# Patient Record
Sex: Female | Born: 1995 | Race: White | Hispanic: No | Marital: Single | Smoking: Never smoker
Health system: Southern US, Community
[De-identification: ages and names within clinical notes are randomized; demographics above are authoritative.]

## PROBLEM LIST (undated history)

## (undated) ENCOUNTER — Inpatient Hospital Stay (HOSPITAL_COMMUNITY): Payer: Self-pay

## (undated) DIAGNOSIS — K5190 Ulcerative colitis, unspecified, without complications: Secondary | ICD-10-CM

## (undated) DIAGNOSIS — Z8489 Family history of other specified conditions: Secondary | ICD-10-CM

## (undated) DIAGNOSIS — T8859XA Other complications of anesthesia, initial encounter: Secondary | ICD-10-CM

## (undated) DIAGNOSIS — J45909 Unspecified asthma, uncomplicated: Secondary | ICD-10-CM

## (undated) DIAGNOSIS — F419 Anxiety disorder, unspecified: Secondary | ICD-10-CM

## (undated) DIAGNOSIS — T4145XA Adverse effect of unspecified anesthetic, initial encounter: Secondary | ICD-10-CM

## (undated) DIAGNOSIS — K219 Gastro-esophageal reflux disease without esophagitis: Secondary | ICD-10-CM

## (undated) DIAGNOSIS — K51211 Ulcerative (chronic) proctitis with rectal bleeding: Secondary | ICD-10-CM

## (undated) DIAGNOSIS — K529 Noninfective gastroenteritis and colitis, unspecified: Secondary | ICD-10-CM

## (undated) DIAGNOSIS — D649 Anemia, unspecified: Secondary | ICD-10-CM

## (undated) HISTORY — DX: Ulcerative (chronic) proctitis with rectal bleeding: K51.211

---
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
## 2015-03-17 DIAGNOSIS — K920 Hematemesis: Secondary | ICD-10-CM | POA: Insufficient documentation

## 2015-03-17 DIAGNOSIS — K219 Gastro-esophageal reflux disease without esophagitis: Secondary | ICD-10-CM | POA: Insufficient documentation

## 2015-03-17 DIAGNOSIS — K5190 Ulcerative colitis, unspecified, without complications: Secondary | ICD-10-CM | POA: Insufficient documentation

## 2015-03-17 DIAGNOSIS — R1030 Lower abdominal pain, unspecified: Secondary | ICD-10-CM | POA: Insufficient documentation

## 2015-04-28 ENCOUNTER — Other Ambulatory Visit: Payer: Self-pay | Admitting: Physician Assistant

## 2015-04-28 DIAGNOSIS — M542 Cervicalgia: Secondary | ICD-10-CM

## 2015-04-28 DIAGNOSIS — M545 Low back pain: Secondary | ICD-10-CM

## 2015-04-28 DIAGNOSIS — M546 Pain in thoracic spine: Secondary | ICD-10-CM

## 2015-05-11 ENCOUNTER — Ambulatory Visit: Payer: Medicaid Other | Admitting: Neurology

## 2015-05-16 ENCOUNTER — Ambulatory Visit
Admission: RE | Admit: 2015-05-16 | Discharge: 2015-05-16 | Disposition: A | Discharge status: Home / Self Care | Payer: Medicaid Other | Source: Ambulatory Visit | Attending: Physician Assistant | Admitting: Physician Assistant

## 2015-05-16 ENCOUNTER — Encounter: Payer: Self-pay | Admitting: Neurology

## 2015-05-16 DIAGNOSIS — M545 Low back pain: Secondary | ICD-10-CM

## 2015-05-16 DIAGNOSIS — M542 Cervicalgia: Secondary | ICD-10-CM

## 2015-05-16 DIAGNOSIS — M546 Pain in thoracic spine: Secondary | ICD-10-CM

## 2015-05-18 ENCOUNTER — Ambulatory Visit (INDEPENDENT_AMBULATORY_CARE_PROVIDER_SITE_OTHER): Payer: Medicaid Other | Admitting: Neurology

## 2015-05-18 ENCOUNTER — Encounter: Payer: Self-pay | Admitting: Neurology

## 2015-05-18 VITALS — BP 104/76 | HR 64 | Resp 16 | Ht 63.5 in | Wt 152.0 lb

## 2015-05-18 DIAGNOSIS — F329 Major depressive disorder, single episode, unspecified: Secondary | ICD-10-CM | POA: Diagnosis not present

## 2015-05-18 DIAGNOSIS — G8929 Other chronic pain: Secondary | ICD-10-CM | POA: Diagnosis not present

## 2015-05-18 DIAGNOSIS — F411 Generalized anxiety disorder: Secondary | ICD-10-CM

## 2015-05-18 DIAGNOSIS — M549 Dorsalgia, unspecified: Secondary | ICD-10-CM

## 2015-05-18 DIAGNOSIS — R404 Transient alteration of awareness: Secondary | ICD-10-CM | POA: Diagnosis not present

## 2015-05-18 DIAGNOSIS — F4310 Post-traumatic stress disorder, unspecified: Secondary | ICD-10-CM | POA: Insufficient documentation

## 2015-05-18 DIAGNOSIS — F32A Depression, unspecified: Secondary | ICD-10-CM | POA: Insufficient documentation

## 2015-05-18 MED ORDER — BUSPIRONE HCL 15 MG PO TABS: 15.0000 mg | ORAL_TABLET | Freq: Three times a day (TID) | ORAL | Status: DC
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
## 2015-05-30 ENCOUNTER — Ambulatory Visit
Admission: RE | Admit: 2015-05-30 | Discharge: 2015-05-30 | Disposition: A | Discharge status: Home / Self Care | Payer: Medicaid Other | Source: Ambulatory Visit | Attending: Neurology | Admitting: Neurology

## 2015-05-30 DIAGNOSIS — R404 Transient alteration of awareness: Secondary | ICD-10-CM | POA: Diagnosis not present

## 2015-06-07 ENCOUNTER — Ambulatory Visit (INDEPENDENT_AMBULATORY_CARE_PROVIDER_SITE_OTHER): Payer: Medicaid Other | Admitting: Neurology

## 2015-06-07 ENCOUNTER — Telehealth: Payer: Self-pay | Admitting: Neurology

## 2015-06-07 DIAGNOSIS — R4182 Altered mental status, unspecified: Secondary | ICD-10-CM | POA: Diagnosis not present

## 2015-06-07 DIAGNOSIS — R404 Transient alteration of awareness: Secondary | ICD-10-CM

## 2015-06-07 NOTE — Telephone Encounter (Signed)
Patient called 9:48am to advise she is running a little behind, will be here in 25 minutes for 10:30am EEG appointment.

## 2015-06-07 NOTE — Progress Notes (Signed)
   STUDY DATE: 06/07/2015  PATIENT NAME: [name] [name] [id number] DOB: 1995-05-20 MRN: [id number]   TECHNIQUE: Electroencephalogram was recorded utilizing standard 10-20 system of lead placement and reformatted into average and bipolar montages.  RECORDING TIME: 25 minutes ACTIVATION: Photic stimulation and hyperventilation  CLINICAL INFORMATION: 20 year old woman with episodes of transient alteration of awareness. There has not been generalized tonic-clonic activity.  FINDINGS: There was an occipital background rhythm of 12 hertz that reacted to eye opening and closing with intermixed activity anteriorly. There were no focal, lateralizing, epileptiform activity or seizures seen. Patient was recorded in the awake, drowsy and sleep state. There was a normal response to photic simulation. Hyperventilation did not change the underlying rhythms. EKG channel shows no arrhythmias.  IMPRESSION: This is a normal EKG was the patient was awake and asleep.   Babe Anthis A. Felecia Shelling, MD, PhD Certified in Neurology, [place] Neurophysiology, Sleep Medicine, Pain Medicine and Neuroimaging  [facility] [address], [place], [place] [address] [phone]

## 2015-06-15 ENCOUNTER — Ambulatory Visit (INDEPENDENT_AMBULATORY_CARE_PROVIDER_SITE_OTHER): Payer: Medicaid Other | Admitting: Neurology

## 2015-06-15 ENCOUNTER — Encounter: Payer: Self-pay | Admitting: Neurology

## 2015-06-15 VITALS — BP 94/64 | HR 70 | Resp 16 | Ht 63.5 in | Wt 147.0 lb

## 2015-06-15 DIAGNOSIS — R404 Transient alteration of awareness: Secondary | ICD-10-CM | POA: Diagnosis not present

## 2015-06-15 DIAGNOSIS — F32A Depression, unspecified: Secondary | ICD-10-CM

## 2015-06-15 DIAGNOSIS — F4310 Post-traumatic stress disorder, unspecified: Secondary | ICD-10-CM | POA: Diagnosis not present

## 2015-06-15 DIAGNOSIS — G8929 Other chronic pain: Secondary | ICD-10-CM

## 2015-06-15 DIAGNOSIS — F329 Major depressive disorder, single episode, unspecified: Secondary | ICD-10-CM

## 2015-06-15 DIAGNOSIS — F411 Generalized anxiety disorder: Secondary | ICD-10-CM

## 2015-06-15 DIAGNOSIS — M549 Dorsalgia, unspecified: Secondary | ICD-10-CM

## 2015-06-15 MED ORDER — BUSPIRONE HCL 15 MG PO TABS: 15.0000 mg | ORAL_TABLET | Freq: Two times a day (BID) | ORAL | Status: DC
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
## 2015-06-22 ENCOUNTER — Telehealth: Payer: Self-pay | Admitting: Neurology

## 2015-06-22 DIAGNOSIS — F411 Generalized anxiety disorder: Secondary | ICD-10-CM

## 2015-06-22 NOTE — Telephone Encounter (Signed)
Patient came in and said the Buspar 15 MG is making her feel very dizzy. She said she only taking half the 15 MG and once she starts to take it shortly after is when she is starting to feel dizzy spells and when she is at work it happens and she has to sit down for a little bit. She said she is also on birth control. The best number to contact the patient is [phone]

## 2015-06-22 NOTE — Telephone Encounter (Signed)
Let's refer to Behavioral health for anxiety

## 2015-06-23 ENCOUNTER — Telehealth: Payer: Self-pay | Admitting: *Deleted

## 2015-06-23 DIAGNOSIS — Z0289 Encounter for other administrative examinations: Secondary | ICD-10-CM

## 2015-06-23 NOTE — Telephone Encounter (Signed)
LMTC./fim 

## 2015-06-23 NOTE — Telephone Encounter (Signed)
Pt DMV form on [employer].

## 2015-06-30 NOTE — Telephone Encounter (Signed)
DMV paperwork completed.  I was going to fax this back to the [facility] DMV for pt., but she has not completed her portion and signed, on p. 1.  I lmom for pt. that I will mail the originals to her home address.  She can send them back to the [facility] after competing her portion.  Copy sent to be scanned into EPIC/fim

## 2015-06-30 NOTE — Telephone Encounter (Signed)
I have spoken with [name] [name] this morning and advised that 1--DMV paperwork will be completed today, and per her request, will fax to [facility] DMV and mail her a copy, and 2--[facility] would like to refer her to behavioral health for treatment of anxiety.  [name] [name] is agreeable.  Referral ordered/fim

## 2015-07-14 ENCOUNTER — Telehealth: Payer: Self-pay | Admitting: *Deleted

## 2015-07-14 NOTE — Telephone Encounter (Signed)
Message For: OFFICE               Taken  1-JUN-17 at  2:12PM by [name] ------------------------------------------------------------ [name] [name]         CID [id number]  Patient SAME                 Pt's Dr Felecia Shelling        Area Code 336 Phone# 59 [place] [phone]     RE MISSED CALL FROM THE NURSE, PLS C/B                                                                    Disp:Y/N N If Y = C/B If No Response In 89minutes ============================================================

## 2015-07-14 NOTE — Telephone Encounter (Signed)
I have spoken with [facility] this afternoon.  DMV paperwork has been faxed in/fim

## 2015-09-01 ENCOUNTER — Other Ambulatory Visit (HOSPITAL_COMMUNITY): Payer: Self-pay | Admitting: Obstetrics and Gynecology

## 2015-09-02 NOTE — Patient Instructions (Signed)
Your procedure is scheduled on:  Monday, September 12, 2015  Enter through the Main Entrance of [facility] at:  11:30 AM  Pick up the phone at the desk and dial [phone].  Call this number if you have problems the morning of surgery: [phone].  Remember: Do NOT eat food:  After Midnight Sunday, September 11, 2015  Do NOT drink clear liquids after:  9:00 AM day of surgery  Take these medicines the morning of surgery with a SIP OF WATER:  Apriso  Do NOT wear jewelry (body piercing), metal hair clips/bobby pins, make-up, or nail polish. Do NOT wear lotions, powders, or perfumes.  You may wear deodorant. Do NOT shave for 48 hours prior to surgery. Do NOT bring valuables to the hospital. Contacts, dentures, or bridgework may not be worn into surgery.  Have a responsible adult drive you home and stay with you for 24 hours after your procedure

## 2015-09-05 ENCOUNTER — Encounter (HOSPITAL_COMMUNITY)
Admission: RE | Admit: 2015-09-05 | Discharge: 2015-09-05 | Disposition: A | Discharge status: Home / Self Care | Payer: Medicaid Other | Source: Ambulatory Visit | Attending: Obstetrics and Gynecology | Admitting: Obstetrics and Gynecology

## 2015-09-05 ENCOUNTER — Encounter (HOSPITAL_COMMUNITY): Payer: Self-pay

## 2015-09-05 DIAGNOSIS — Z01818 Encounter for other preprocedural examination: Secondary | ICD-10-CM | POA: Insufficient documentation

## 2015-09-05 HISTORY — DX: Adverse effect of unspecified anesthetic, initial encounter: T41.45XA

## 2015-09-05 HISTORY — DX: Other complications of anesthesia, initial encounter: T88.59XA

## 2015-09-05 HISTORY — DX: Anemia, unspecified: D64.9

## 2015-09-05 LAB — CBC
HCT: 38.9 % (ref 36.0–46.0)
Hemoglobin: 12.7 g/dL (ref 12.0–15.0)
MCH: 28.3 pg (ref 26.0–34.0)
MCHC: 32.6 g/dL (ref 30.0–36.0)
MCV: 86.8 fL (ref 78.0–100.0)
Platelets: 299 10*3/uL (ref 150–400)
RBC: 4.48 MIL/uL (ref 3.87–5.11)
RDW: 14 % (ref 11.5–15.5)
WBC: 8.5 10*3/uL (ref 4.0–10.5)

## 2015-09-12 ENCOUNTER — Ambulatory Visit (HOSPITAL_COMMUNITY)
Admission: RE | Admit: 2015-09-12 | Discharge: 2015-09-12 | Disposition: A | Discharge status: Home / Self Care | Payer: Medicaid Other | Source: Ambulatory Visit | Attending: Obstetrics and Gynecology | Admitting: Obstetrics and Gynecology

## 2015-09-12 ENCOUNTER — Ambulatory Visit (HOSPITAL_COMMUNITY): Payer: Medicaid Other | Admitting: Anesthesiology

## 2015-09-12 ENCOUNTER — Encounter (HOSPITAL_COMMUNITY)
Admission: RE | Disposition: A | Discharge status: Home / Self Care | Payer: Self-pay | Source: Ambulatory Visit | Attending: Obstetrics and Gynecology

## 2015-09-12 ENCOUNTER — Encounter (HOSPITAL_COMMUNITY): Payer: Self-pay | Admitting: Emergency Medicine

## 2015-09-12 DIAGNOSIS — Z113 Encounter for screening for infections with a predominantly sexual mode of transmission: Secondary | ICD-10-CM | POA: Diagnosis not present

## 2015-09-12 DIAGNOSIS — D270 Benign neoplasm of right ovary: Secondary | ICD-10-CM | POA: Insufficient documentation

## 2015-09-12 DIAGNOSIS — R102 Pelvic and perineal pain: Secondary | ICD-10-CM | POA: Diagnosis not present

## 2015-09-12 DIAGNOSIS — Z823 Family history of stroke: Secondary | ICD-10-CM | POA: Diagnosis not present

## 2015-09-12 DIAGNOSIS — N83201 Unspecified ovarian cyst, right side: Secondary | ICD-10-CM | POA: Diagnosis present

## 2015-09-12 HISTORY — PX: LAPAROSCOPY: SHX197

## 2015-09-12 HISTORY — DX: Family history of other specified conditions: Z84.89

## 2015-09-12 HISTORY — PX: LAPAROSCOPIC OVARIAN CYSTECTOMY: SHX6248

## 2015-09-12 LAB — PREGNANCY, URINE: PREG TEST UR: NEGATIVE

## 2015-09-12 SURGERY — LAPAROSCOPY, DIAGNOSTIC
Anesthesia: General | Site: Abdomen | Laterality: Right

## 2015-09-12 MED ORDER — NEOSTIGMINE METHYLSULFATE 10 MG/10ML IV SOLN
INTRAVENOUS | Status: AC
  Filled 2015-09-12: qty 1

## 2015-09-12 MED ORDER — HYDROMORPHONE HCL 2 MG PO TABS
2.0000 mg | ORAL_TABLET | Freq: Once | ORAL | Status: AC
  Administered 2015-09-12: 2 mg via ORAL

## 2015-09-12 MED ORDER — NEOSTIGMINE METHYLSULFATE 10 MG/10ML IV SOLN
INTRAVENOUS | Status: DC | PRN
  Administered 2015-09-12: 3 mg via INTRAVENOUS

## 2015-09-12 MED ORDER — DEXAMETHASONE SODIUM PHOSPHATE 4 MG/ML IJ SOLN
INTRAMUSCULAR | Status: DC | PRN
  Administered 2015-09-12: 4 mg via INTRAVENOUS

## 2015-09-12 MED ORDER — ONDANSETRON HCL 4 MG/2ML IJ SOLN
INTRAMUSCULAR | Status: DC | PRN
  Administered 2015-09-12 (×2): 2 mg via INTRAVENOUS

## 2015-09-12 MED ORDER — HYDROMORPHONE HCL 2 MG PO TABS
ORAL_TABLET | ORAL | Status: AC
  Filled 2015-09-12: qty 1

## 2015-09-12 MED ORDER — PROMETHAZINE HCL 25 MG/ML IJ SOLN: 6.2500 mg | INTRAMUSCULAR | Status: DC | PRN

## 2015-09-12 MED ORDER — LIDOCAINE HCL (CARDIAC) 20 MG/ML IV SOLN
INTRAVENOUS | Status: AC
  Filled 2015-09-12: qty 5

## 2015-09-12 MED ORDER — BUPIVACAINE HCL (PF) 0.25 % IJ SOLN
INTRAMUSCULAR | Status: DC | PRN
  Administered 2015-09-12: 60 mL

## 2015-09-12 MED ORDER — HEPARIN SODIUM (PORCINE) 5000 UNIT/ML IJ SOLN
INTRAMUSCULAR | Status: AC
  Filled 2015-09-12: qty 1

## 2015-09-12 MED ORDER — LACTATED RINGERS IV SOLN
INTRAVENOUS | Status: DC | PRN
Start: 2015-09-12 — End: 2015-09-12
  Administered 2015-09-12 (×2): via INTRAVENOUS

## 2015-09-12 MED ORDER — BUPIVACAINE HCL (PF) 0.25 % IJ SOLN
INTRAMUSCULAR | Status: AC
  Filled 2015-09-12: qty 60

## 2015-09-12 MED ORDER — GLYCOPYRROLATE 0.2 MG/ML IJ SOLN
INTRAMUSCULAR | Status: DC | PRN
  Administered 2015-09-12: 0.4 mg via INTRAVENOUS
  Administered 2015-09-12 (×2): 0.1 mg via INTRAVENOUS

## 2015-09-12 MED ORDER — DEXAMETHASONE SODIUM PHOSPHATE 10 MG/ML IJ SOLN
INTRAMUSCULAR | Status: AC
  Filled 2015-09-12: qty 1

## 2015-09-12 MED ORDER — MIDAZOLAM HCL 2 MG/2ML IJ SOLN
INTRAMUSCULAR | Status: AC
  Filled 2015-09-12: qty 2

## 2015-09-12 MED ORDER — ACETAMINOPHEN 500 MG PO TABS: ORAL_TABLET | ORAL | 0 refills | Status: DC

## 2015-09-12 MED ORDER — SCOPOLAMINE 1 MG/3DAYS TD PT72
1.0000 | MEDICATED_PATCH | Freq: Once | TRANSDERMAL | Status: DC
  Administered 2015-09-12: 1.5 mg via TRANSDERMAL

## 2015-09-12 MED ORDER — PROPOFOL 10 MG/ML IV BOLUS
INTRAVENOUS | Status: AC
  Filled 2015-09-12: qty 20

## 2015-09-12 MED ORDER — FENTANYL CITRATE (PF) 100 MCG/2ML IJ SOLN
25.0000 ug | INTRAMUSCULAR | Status: DC | PRN
  Administered 2015-09-12: 50 ug via INTRAVENOUS

## 2015-09-12 MED ORDER — GLYCOPYRROLATE 0.2 MG/ML IJ SOLN
INTRAMUSCULAR | Status: AC
  Filled 2015-09-12: qty 3

## 2015-09-12 MED ORDER — FENTANYL CITRATE (PF) 100 MCG/2ML IJ SOLN
INTRAMUSCULAR | Status: AC
  Filled 2015-09-12: qty 2

## 2015-09-12 MED ORDER — SCOPOLAMINE 1 MG/3DAYS TD PT72
MEDICATED_PATCH | TRANSDERMAL | Status: AC
  Administered 2015-09-12: 1.5 mg via TRANSDERMAL
  Filled 2015-09-12: qty 1

## 2015-09-12 MED ORDER — LACTATED RINGERS IR SOLN
Status: DC | PRN
  Administered 2015-09-12: 3000 mL

## 2015-09-12 MED ORDER — GLYCOPYRROLATE 0.2 MG/ML IJ SOLN
INTRAMUSCULAR | Status: AC
  Filled 2015-09-12: qty 4

## 2015-09-12 MED ORDER — ROCURONIUM BROMIDE 100 MG/10ML IV SOLN
INTRAVENOUS | Status: AC
  Filled 2015-09-12: qty 1

## 2015-09-12 MED ORDER — LIDOCAINE HCL (CARDIAC) 20 MG/ML IV SOLN
INTRAVENOUS | Status: DC | PRN
  Administered 2015-09-12: 70 mg via INTRAVENOUS

## 2015-09-12 MED ORDER — CEFAZOLIN SODIUM-DEXTROSE 2-3 GM-% IV SOLR
INTRAVENOUS | Status: DC | PRN
  Administered 2015-09-12: 2 g via INTRAVENOUS

## 2015-09-12 MED ORDER — LACTATED RINGERS IV SOLN
INTRAVENOUS | Status: DC
  Administered 2015-09-12 (×2): via INTRAVENOUS

## 2015-09-12 MED ORDER — MIDAZOLAM HCL 2 MG/2ML IJ SOLN
INTRAMUSCULAR | Status: DC | PRN
  Administered 2015-09-12: 1 mg via INTRAVENOUS

## 2015-09-12 MED ORDER — ROCURONIUM BROMIDE 100 MG/10ML IV SOLN
INTRAVENOUS | Status: DC | PRN
  Administered 2015-09-12: 30 mg via INTRAVENOUS
  Administered 2015-09-12 (×2): 10 mg via INTRAVENOUS

## 2015-09-12 MED ORDER — FENTANYL CITRATE (PF) 250 MCG/5ML IJ SOLN
INTRAMUSCULAR | Status: AC
  Filled 2015-09-12: qty 5

## 2015-09-12 MED ORDER — FENTANYL CITRATE (PF) 100 MCG/2ML IJ SOLN
INTRAMUSCULAR | Status: DC | PRN
  Administered 2015-09-12 (×4): 50 ug via INTRAVENOUS

## 2015-09-12 MED ORDER — KETOROLAC TROMETHAMINE 30 MG/ML IJ SOLN
INTRAMUSCULAR | Status: AC
  Filled 2015-09-12: qty 1

## 2015-09-12 MED ORDER — HYDROMORPHONE HCL 2 MG PO TABS: 2.0000 mg | ORAL_TABLET | ORAL | 0 refills | Status: DC | PRN

## 2015-09-12 MED ORDER — CEFAZOLIN SODIUM-DEXTROSE 2-4 GM/100ML-% IV SOLN: 2.0000 g | INTRAVENOUS | Status: DC

## 2015-09-12 MED ORDER — ONDANSETRON HCL 4 MG/2ML IJ SOLN
INTRAMUSCULAR | Status: AC
  Filled 2015-09-12: qty 2

## 2015-09-12 MED ORDER — PROPOFOL 10 MG/ML IV BOLUS
INTRAVENOUS | Status: DC | PRN
  Administered 2015-09-12: 150 mg via INTRAVENOUS

## 2015-09-12 SURGICAL SUPPLY — 32 items
CABLE HIGH FREQUENCY MONO STRZ (ELECTRODE) IMPLANT
CATH ROBINSON RED A/P 16FR (CATHETERS) IMPLANT
CLOTH BEACON ORANGE TIMEOUT ST (SAFETY) ×4 IMPLANT
DRSG COVADERM PLUS 2X2 (GAUZE/BANDAGES/DRESSINGS) ×8 IMPLANT
DRSG OPSITE POSTOP 3X4 (GAUZE/BANDAGES/DRESSINGS) IMPLANT
GLOVE BIOGEL M 6.5 STRL (GLOVE) ×8 IMPLANT
GLOVE BIOGEL PI IND STRL 6.5 (GLOVE) ×2 IMPLANT
GLOVE BIOGEL PI IND STRL 7.0 (GLOVE) ×2 IMPLANT
GLOVE BIOGEL PI INDICATOR 6.5 (GLOVE) ×2
GLOVE BIOGEL PI INDICATOR 7.0 (GLOVE) ×2
GOWN STRL REUS W/TWL LRG LVL3 (GOWN DISPOSABLE) ×8 IMPLANT
NS IRRIG 1000ML POUR BTL (IV SOLUTION) ×4 IMPLANT
PACK LAPAROSCOPY BASIN (CUSTOM PROCEDURE TRAY) ×4 IMPLANT
PAD TRENDELENBURG POSITION (MISCELLANEOUS) ×4 IMPLANT
POUCH SPECIMEN RETRIEVAL 10MM (ENDOMECHANICALS) IMPLANT
SEALER TISSUE G2 CVD JAW 35 (ENDOMECHANICALS) IMPLANT
SEALER TISSUE G2 CVD JAW 45CM (ENDOMECHANICALS) IMPLANT
SET IRRIG TUBING LAPAROSCOPIC (IRRIGATION / IRRIGATOR) IMPLANT
SHEARS HARMONIC ACE PLUS 36CM (ENDOMECHANICALS) IMPLANT
SLEEVE XCEL OPT CAN 5 100 (ENDOMECHANICALS) ×4 IMPLANT
SOLUTION ELECTROLUBE (MISCELLANEOUS) IMPLANT
SUT VIC AB 0 CTX 36 (SUTURE)
SUT VIC AB 0 CTX36XBRD ANBCTRL (SUTURE) IMPLANT
SUT VIC AB 4-0 PS2 27 (SUTURE) ×8 IMPLANT
SUT VICRYL 0 UR6 27IN ABS (SUTURE) ×4 IMPLANT
TOWEL OR 17X24 6PK STRL BLUE (TOWEL DISPOSABLE) ×8 IMPLANT
TRAY FOLEY BAG SILVER LF 16FR (SET/KITS/TRAYS/PACK) ×4 IMPLANT
TRAY FOLEY CATH SILVER 14FR (SET/KITS/TRAYS/PACK) ×4 IMPLANT
TROCAR XCEL NON-BLD 11X100MML (ENDOMECHANICALS) ×4 IMPLANT
TROCAR XCEL NON-BLD 5MMX100MML (ENDOMECHANICALS) ×4 IMPLANT
WARMER LAPAROSCOPE (MISCELLANEOUS) ×4 IMPLANT
WATER STERILE IRR 1000ML POUR (IV SOLUTION) ×4 IMPLANT

## 2015-09-12 NOTE — Discharge Instructions (Signed)
DISCHARGE INSTRUCTIONS: Laparoscopy ° °The following instructions have been prepared to help you care for yourself upon your return home today. ° °Wound care: °• Do not get the incision wet for the first 24 hours. The incision should be kept clean and dry. °• The Band-Aids or dressings may be removed the day after surgery. °• Should the incision become sore, red, and swollen after the first week, check with your doctor. ° °Personal hygiene: °• Shower the day after your procedure. ° °Activity and limitations: °• Do NOT drive or operate any equipment today. °• Do NOT lift anything more than 15 pounds for 2-3 weeks after surgery. °• Do NOT rest in bed all day. °• Walking is encouraged. Walk each day, starting slowly with 5-minute walks 3 or 4 times a day. Slowly increase the length of your walks. °• Walk up and down stairs slowly. °• Do NOT do strenuous activities, such as golfing, playing tennis, bowling, running, biking, weight lifting, gardening, mowing, or vacuuming for 2-4 weeks. Ask your doctor when it is okay to start. ° °Diet: Eat a light meal as desired this evening. You may resume your usual diet tomorrow. ° °Return to work: This is dependent on the type of work you do. For the most part you can return to a desk job within a week of surgery. If you are more active at work, please discuss this with your doctor. ° °What to expect after your surgery: You may have a slight burning sensation when you urinate on the first day. You may have a very small amount of blood in the urine. Expect to have a small amount of vaginal discharge/light bleeding for 1-2 weeks. It is not unusual to have abdominal soreness and bruising for up to 2 weeks. You may be tired and need more rest for about 1 week. You may experience shoulder pain for 24-72 hours. Lying flat in bed may relieve it. ° °Call your doctor for any of the following: °• Develop a fever of 100.4 or greater °• Inability to urinate 6 hours after discharge from  hospital °• Severe pain not relieved by pain medications °• Persistent of heavy bleeding at incision site °• Redness or swelling around incision site after a week °• Increasing nausea or vomiting ° °Patient Signature________________________________________ °Nurse Signature_________________________________________DISCHARGE INSTRUCTIONS: Laparoscopy ° °The following instructions have been prepared to help you care for yourself upon your return home today. ° °Wound care: °• Do not get the incision wet for the first 24 hours. The incision should be kept clean and dry. °• The Band-Aids or dressings may be removed the day after surgery. °• Should the incision become sore, red, and swollen after the first week, check with your doctor. ° °Personal hygiene: °• Shower the day after your procedure. ° °Activity and limitations: °• Do NOT drive or operate any equipment today. °• Do NOT lift anything more than 15 pounds for 2-3 weeks after surgery. °• Do NOT rest in bed all day. °• Walking is encouraged. Walk each day, starting slowly with 5-minute walks 3 or 4 times a day. Slowly increase the length of your walks. °• Walk up and down stairs slowly. °• Do NOT do strenuous activities, such as golfing, playing tennis, bowling, running, biking, weight lifting, gardening, mowing, or vacuuming for 2-4 weeks. Ask your doctor when it is okay to start. ° °Diet: Eat a light meal as desired this evening. You may resume your usual diet tomorrow. ° °Return to work: This is dependent on   the type of work you do. For the most part you can return to a desk job within a week of surgery. If you are more active at work, please discuss this with your doctor. ° °What to expect after your surgery: You may have a slight burning sensation when you urinate on the first day. You may have a very small amount of blood in the urine. Expect to have a small amount of vaginal discharge/light bleeding for 1-2 weeks. It is not unusual to have abdominal soreness  and bruising for up to 2 weeks. You may be tired and need more rest for about 1 week. You may experience shoulder pain for 24-72 hours. Lying flat in bed may relieve it. ° °Call your doctor for any of the following: °• Develop a fever of 100.4 or greater °• Inability to urinate 6 hours after discharge from hospital °• Severe pain not relieved by pain medications °• Persistent of heavy bleeding at incision site °• Redness or swelling around incision site after a week °• Increasing nausea or vomiting ° °Patient Signature________________________________________ °Nurse Signature_________________________________________ °

## 2015-09-12 NOTE — Anesthesia Preprocedure Evaluation (Signed)
Anesthesia Evaluation  Patient identified by MRN, date of birth, ID band Patient awake    Reviewed: Allergy & Precautions, NPO status , Patient's Chart, lab work & pertinent test results  History of Anesthesia Complications (+) PROLONGED EMERGENCE and history of anesthetic complications  Airway Mallampati: II  TM Distance: >3 FB Neck ROM: Full    Dental  (+) Teeth Intact, Dental Advisory Given   Pulmonary neg pulmonary ROS,    Pulmonary exam normal breath sounds clear to auscultation       Cardiovascular Exercise Tolerance: Good negative cardio ROS Normal cardiovascular exam Rhythm:Regular Rate:Normal     Neuro/Psych PSYCHIATRIC DISORDERS Anxiety Depression negative neurological ROS     GI/Hepatic Neg liver ROS, Ulcerative colitis    Endo/Other  negative endocrine ROS  Renal/GU negative Renal ROS     Musculoskeletal negative musculoskeletal ROS (+)   Abdominal   Peds  Hematology negative hematology ROS (+)   Anesthesia Other Findings Day of surgery medications reviewed with the patient.  Reproductive/Obstetrics negative OB ROS                             Anesthesia Physical Anesthesia Plan  ASA: II  Anesthesia Plan: General   Post-op Pain Management:    Induction: Intravenous  Airway Management Planned: Oral ETT  Additional Equipment:   Intra-op Plan:   Post-operative Plan: Extubation in OR  Informed Consent: I have reviewed the patients History and Physical, chart, labs and discussed the procedure including the risks, benefits and alternatives for the proposed anesthesia with the patient or authorized representative who has indicated his/her understanding and acceptance.   Dental advisory given  Plan Discussed with: CRNA  Anesthesia Plan Comments: (Risks/benefits of general anesthesia discussed with patient including risk of damage to teeth, lips, gum, and tongue,  nausea/vomiting, allergic reactions to medications, and the possibility of heart attack, stroke and death.  All patient questions answered.  Patient wishes to proceed.)        Anesthesia Quick Evaluation

## 2015-09-12 NOTE — Anesthesia Procedure Notes (Signed)
Procedure Name: Intubation Date/Time: 09/12/2015 1:15 PM Performed by: Catalina Gravel Pre-anesthesia Checklist: Patient identified, Patient being monitored, Timeout performed, Emergency Drugs available and Suction available Patient Re-evaluated:Patient Re-evaluated prior to inductionOxygen Delivery Method: Circle System Utilized Preoxygenation: Pre-oxygenation with 100% oxygen Intubation Type: IV induction Ventilation: Mask ventilation without difficulty Laryngoscope Size: Mac and 3 Grade View: Grade I Tube type: Oral Tube size: 7.0 mm Number of attempts: 1 Airway Equipment and Method: Stylet Placement Confirmation: ETT inserted through vocal cords under direct vision,  positive ETCO2 and breath sounds checked- equal and bilateral Secured at: 21 cm Tube secured with: Tape Dental Injury: Teeth and Oropharynx as per pre-operative assessment

## 2015-09-12 NOTE — Transfer of Care (Signed)
Immediate Anesthesia Transfer of Care Note  Patient: [name] [name]  Procedure(s) Performed: Procedure(s) with comments: LAPAROSCOPY DIAGNOSTIC (N/A) RIGHT LAPAROSCOPIC OOPHORECTOMY (Right) - Possible Right Oophorectomy  Patient Location: PACU  Anesthesia Type:General  Level of Consciousness: awake, alert  and oriented  Airway & Oxygen Therapy: Patient Spontanous Breathing and Patient connected to nasal cannula oxygen  Post-op Assessment: Report given to RN and Post -op Vital signs reviewed and stable  Post vital signs: Reviewed and stable  Last Vitals:  Vitals:   09/12/15 1113  BP: 113/74  Pulse: 81  Resp: 16  Temp: 36.3 C    Last Pain:  Vitals:   09/12/15 1113  TempSrc: Oral         Complications: No apparent anesthesia complications
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
## 2015-09-12 NOTE — Op Note (Signed)
09/12/2015  3:15 PM  PATIENT:  [name] [name]  20 y.o. female  PRE-OPERATIVE DIAGNOSIS:   Right Ovarian Cyst  POST-OPERATIVE DIAGNOSIS:  RIGHT OVARIAN CYST  PROCEDURE:  Procedure(s) with comments: LAPAROSCOPY DIAGNOSTIC (N/A) RIGHT LAPAROSCOPIC OOPHORECTOMY (Right) - Possible Right Oophorectomy  SURGEON:  Surgeon(s) and Role:    * Christophe Louis, MD - Primary    * Thurnell Lose, MD - Assisting  PHYSICIAN ASSISTANT: Dr. Thurnell Lose assisted due to the complexity of the procedure and concern for adhesive disease  ASSISTANTS: see above    ANESTHESIA:   general  EBL:  Total I/O In: 1300 [I.V.:1300] Out: 125 [Urine:100; Blood:25]  BLOOD ADMINISTERED:none  DRAINS: Urinary Catheter (Foley)   LOCAL MEDICATIONS USED:  MARCAINE     SPECIMEN:  Source of Specimen:  Pelvic Washing and Right ovary   DISPOSITION OF SPECIMEN:  PATHOLOGY  COUNTS:  YES  TOURNIQUET:  * No tourniquets in log *  DICTATION: .Dragon Dictation  PLAN OF CARE: Discharge to home after PACU  PATIENT DISPOSITION:  PACU - hemodynamically stable.   Delay start of Pharmacological VTE agent (>24hrs) due to surgical blood loss or risk of bleeding: not applicable  Findings: Complex right ovarian cyst .. Normal appearing left fallopian tube and ovary. Normal right fallopian tube. .. The uterus appeared normal    Procedure: The patient was taken to the operating room placed under general anesthesia. Prepped and draped in the normal sterile fashion. A foley catheter was placed. A uterine manipulator was placed. Attention was turned to the abdomen where the umbilicus was injected with 10 cc of marcaine. A 10 mm trocar was placed under direct visualization. Pneumoperitoneum was achieved with C02 gas... A 5 mm trocar was placed in the right and left lower quadrants. Each trocar site was injected with 10 cc of marcaine prior to trocar placement. The harmonic scalpel was used to incise the right ovarian cyst. Serous  fluid was noted. An attempt was made to isolate the cyst wall from the ovary. This resulted in tearing of the ovarian tissue and bleeding. Due to the adherent nature of the cyst and tearing of the ovarian tissue I decided to excise the right ovary. . The right ureter was identified.  The Right mesosalpinx was transected with the harmonic scalpel. The right infundibulopelvic ligament was transected with the harmonic scalpel. There was bleeding noted from the mesosalpinx. This was controlled with the Kleppinger. The 10 mm laparoscope was removed. The endo catch was placed through the 10 mmm port. A 5 mm laparoscope was inserted into the left lower quadrant port. The right ovary was placed in the endo catch and removed through the umbilical incision. The  pneumoperitoneum was reestablished. The pelvis was irrigated. Excellent hemostasis was noted. All trocars were removed under direct visualization . The pneumoperitoneum was released.  The fascia of the umbilical incision was closed with 0 vicryl. The skin incisions were closed with 4-0 vicryl and derma bond.  The patient was taken to the recovery room awake and in stable condition.  Sponge lap and needle counts were correct times 2.

## 2015-09-13 NOTE — Anesthesia Postprocedure Evaluation (Signed)
Anesthesia Post Note  Patient: [name] [name]  Procedure(s) Performed: Procedure(s) (LRB): LAPAROSCOPY DIAGNOSTIC (N/A) RIGHT LAPAROSCOPIC OOPHORECTOMY (Right)  Patient location during evaluation: PACU Anesthesia Type: General Level of consciousness: awake and alert Pain management: pain level controlled Vital Signs Assessment: post-procedure vital signs reviewed and stable Respiratory status: spontaneous breathing, nonlabored ventilation, respiratory function stable and patient connected to nasal cannula oxygen Cardiovascular status: blood pressure returned to baseline and stable Postop Assessment: no signs of nausea or vomiting Anesthetic complications: no    Last Vitals:  Vitals:   09/12/15 1715 09/12/15 1745  BP: 110/76 112/78  Pulse: 72 74  Resp: 16 16  Temp: 36.8 C 36.9 C    Last Pain:  Vitals:   09/12/15 1745  TempSrc:   PainSc: [place]

## 2015-09-15 ENCOUNTER — Encounter (HOSPITAL_COMMUNITY): Payer: Self-pay | Admitting: Obstetrics and Gynecology

## 2015-09-16 NOTE — Progress Notes (Signed)
Late entry- When spoke with pt 8/2 for post op phone call, advised pt to contact surgeons office re: continued severe pain. Pt stated it was worse when she was trying to sit up or move around or take deep breaths.  Was not having difficulty breathing except pain related issues, no tightness or wheezing.  Advised pt on salt water gargles, fluids, lozenges for sore throat. Also advised pt to discuss n/v with md if it continued, pt stated she was able to drink and eat. Discussed blood clots, pt stated she had not had any since the previous day. Advised to discuss w/ MD if further bleeding or getting worse. Pt stated she was taking her meds q6h. Advised that rx info in discharge paperwork stated q4h frequency. Also discussed tylenol instructions written by MD. Pt given opportunity to ask questions, all answered, pt verbalized understanding of instructions and to call surgeons office.  Advised pt she could call here to recovery as well with any further questions or concerns. Nicki Reaper RN

## 2015-09-23 ENCOUNTER — Inpatient Hospital Stay (HOSPITAL_COMMUNITY): Payer: Medicaid Other

## 2015-09-23 ENCOUNTER — Encounter (HOSPITAL_COMMUNITY): Payer: Self-pay | Admitting: *Deleted

## 2015-09-23 ENCOUNTER — Inpatient Hospital Stay (HOSPITAL_COMMUNITY)
Admission: AD | Admit: 2015-09-23 | Discharge: 2015-09-23 | Disposition: A | Discharge status: Home / Self Care | Payer: Medicaid Other | Source: Ambulatory Visit | Attending: Obstetrics and Gynecology | Admitting: Obstetrics and Gynecology

## 2015-09-23 DIAGNOSIS — R1012 Left upper quadrant pain: Secondary | ICD-10-CM | POA: Diagnosis not present

## 2015-09-23 DIAGNOSIS — R110 Nausea: Secondary | ICD-10-CM | POA: Diagnosis not present

## 2015-09-23 DIAGNOSIS — R1013 Epigastric pain: Secondary | ICD-10-CM | POA: Insufficient documentation

## 2015-09-23 DIAGNOSIS — R1110 Vomiting, unspecified: Secondary | ICD-10-CM | POA: Diagnosis present

## 2015-09-23 DIAGNOSIS — R1010 Upper abdominal pain, unspecified: Secondary | ICD-10-CM

## 2015-09-23 DIAGNOSIS — F419 Anxiety disorder, unspecified: Secondary | ICD-10-CM | POA: Insufficient documentation

## 2015-09-23 DIAGNOSIS — K5190 Ulcerative colitis, unspecified, without complications: Secondary | ICD-10-CM | POA: Diagnosis not present

## 2015-09-23 DIAGNOSIS — K5900 Constipation, unspecified: Secondary | ICD-10-CM | POA: Insufficient documentation

## 2015-09-23 DIAGNOSIS — R1011 Right upper quadrant pain: Secondary | ICD-10-CM

## 2015-09-23 LAB — CBC
HEMATOCRIT: 34.7 % — AB (ref 36.0–46.0)
Hemoglobin: 11.6 g/dL — ABNORMAL LOW (ref 12.0–15.0)
MCH: 28.5 pg (ref 26.0–34.0)
MCHC: 33.4 g/dL (ref 30.0–36.0)
MCV: 85.3 fL (ref 78.0–100.0)
Platelets: 275 10*3/uL (ref 150–400)
RBC: 4.07 MIL/uL (ref 3.87–5.11)
RDW: 13.4 % (ref 11.5–15.5)
WBC: 7.3 10*3/uL (ref 4.0–10.5)

## 2015-09-23 LAB — URINALYSIS, ROUTINE W REFLEX MICROSCOPIC
Bilirubin Urine: NEGATIVE
GLUCOSE, UA: NEGATIVE mg/dL
Ketones, ur: NEGATIVE mg/dL
Leukocytes, UA: NEGATIVE
NITRITE: NEGATIVE
PH: 5.5 (ref 5.0–8.0)
PROTEIN: NEGATIVE mg/dL
Specific Gravity, Urine: >1.03 — ABNORMAL HIGH (ref 1.005–1.030)

## 2015-09-23 LAB — URINE MICROSCOPIC-ADD ON: WBC UA: NONE SEEN WBC/hpf (ref 0–5)

## 2015-09-23 LAB — COMPREHENSIVE METABOLIC PANEL
ALBUMIN: 3.9 g/dL (ref 3.5–5.0)
ALT: 14 U/L (ref 14–54)
AST: 17 U/L (ref 15–41)
Alkaline Phosphatase: 52 U/L (ref 38–126)
Anion gap: 7 (ref 5–15)
BILIRUBIN TOTAL: 0.3 mg/dL (ref 0.3–1.2)
BUN: 14 mg/dL (ref 6–20)
CHLORIDE: 105 mmol/L (ref 101–111)
CO2: 25 mmol/L (ref 22–32)
CREATININE: 0.64 mg/dL (ref 0.44–1.00)
Calcium: 9.3 mg/dL (ref 8.9–10.3)
GFR calc Af Amer: >60 mL/min (ref >60)
GFR calc non Af Amer: >60 mL/min (ref >60)
GLUCOSE: 114 mg/dL — AB (ref 65–99)
POTASSIUM: 4 mmol/L (ref 3.5–5.1)
Sodium: 137 mmol/L (ref 135–145)
Total Protein: 7.4 g/dL (ref 6.5–8.1)

## 2015-09-23 LAB — LIPASE, BLOOD: Lipase: 67 U/L — ABNORMAL HIGH (ref 11–51)

## 2015-09-23 LAB — POCT PREGNANCY, URINE: Preg Test, Ur: NEGATIVE

## 2015-09-23 LAB — AMYLASE: Amylase: 63 U/L (ref 28–100)

## 2015-09-23 MED ORDER — METOCLOPRAMIDE HCL 10 MG PO TABS: 10.0000 mg | ORAL_TABLET | Freq: Four times a day (QID) | ORAL | 0 refills | Status: DC | PRN

## 2015-09-23 MED ORDER — POLYETHYLENE GLYCOL 3350 17 G PO PACK: 17.0000 g | PACK | Freq: Every day | ORAL | Status: DC

## 2015-09-23 MED ORDER — METOCLOPRAMIDE HCL 10 MG PO TABS
10.0000 mg | ORAL_TABLET | Freq: Four times a day (QID) | ORAL | Status: DC | PRN
  Administered 2015-09-23: 10 mg via ORAL
  Filled 2015-09-23: qty 1

## 2015-09-23 NOTE — Progress Notes (Signed)
Waiting for abd x-ray results. Called x- ray tech. Will call back after talking with radiologist.

## 2015-09-23 NOTE — MAU Provider Note (Signed)
History     CSN: [id number]  Arrival date and time: 09/23/15 2007   First Provider Initiated Contact with Patient 09/23/15 2055      Chief Complaint  Patient presents with  . Emesis   Non-pregnant female c/o N/V and upper abd pain x1 week. She is 1.5 weeks post-op from Rt oophorectomy for ovarian cyst. She reports vomiting is intermittent about 1-3 times per day but nausea is constant. She also reports aversion to food smells but not poor appetite. She states upper abd pain started around the same time and is intermittent and dull. She denies fever and diarrhea but reports some constipation and has had 2 BMs in the last 11 days. She denies urinary sx. She states her incisional pain has been minimal. She has hx of ulcerative colitis and currently on meds.    Pertinent Gynecological History: Menses: 08/29/15 Contraception: OCP (estrogen/progesterone)  Past Medical History:  Diagnosis Date  . Anemia   . Anxiety   . 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 Complication of anesthesia    hard time waking up  . Family history of adverse reaction to anesthesia    mother had a hard time of waking up  . Inflammatory bowel disease     Past Surgical History:  Procedure Laterality Date  . COLONOSCOPY W/ BIOPSIES  07-11-07   Colonoscopy/Biopsy, dx ulcerative proctitis  . LAPAROSCOPIC OVARIAN CYSTECTOMY Right 09/12/2015   Procedure: RIGHT LAPAROSCOPIC OOPHORECTOMY;  Surgeon: Christophe Louis, MD;  Location: [facility];  Service: Gynecology;  Laterality: Right;  Possible Right Oophorectomy  . LAPAROSCOPY N/A 09/12/2015   Procedure: LAPAROSCOPY DIAGNOSTIC;  Surgeon: Christophe Louis, MD;  Location: [facility];  Service: Gynecology;  Laterality: N/A;    Family History  Problem Relation Age of Onset  . Hypertension Paternal Grandmother   . Diabetes Paternal Grandmother   . Heart disease Paternal Grandfather   . Inflammatory bowel disease Neg Hx      Social History  Substance Use Topics  . Smoking status: Never Smoker  . Smokeless tobacco: Never Used  . Alcohol use No    Allergies:  Allergies  Allergen Reactions  . Codeine Nausea Only  . Doxycycline Nausea And Vomiting  . Triamcinolone Hives and Swelling    Prescriptions Prior to Admission  Medication Sig Dispense Refill Last Dose  . acetaminophen (TYLENOL) 500 MG tablet 1-2 every 8 hours as needed for mild pain 30 tablet 0   . APRISO 0.375 g 24 hr capsule Take 1.5 g by mouth daily. Reported on 06/15/2015  11 09/12/2015 at Unknown time  . HYDROmorphone (DILAUDID) 2 MG tablet Take 1 tablet (2 mg total) by mouth every 4 (four) hours as needed for severe pain. 30 tablet 0   . norgestimate-ethinyl estradiol (SPRINTEC 28) 0.25-35 MG-MCG tablet Take 1 tablet by mouth daily.   09/11/2015 at Unknown time    Review of Systems  Constitutional: Negative.  Negative for fever.  Gastrointestinal: Positive for abdominal pain, nausea and vomiting.  Genitourinary: Negative.    Physical Exam   Blood pressure 114/69, pulse 89, temperature 98.3 F (36.8 C), height 5\' 3"  (1.6 m), weight 68.1 kg (150 lb 3.2 oz), last menstrual period 08/29/2015.  Physical Exam  Constitutional: She is oriented to person, place, and time. She appears well-developed and well-nourished.  HENT:  Head: Normocephalic and atraumatic.  Neck: Normal range of motion. Neck supple.  Cardiovascular: Normal rate and regular rhythm.   Respiratory: Effort  normal and breath sounds normal.  GI: Soft. Bowel sounds are normal. She exhibits no distension and no mass. There is no tenderness. There is no rebound and no guarding.  Musculoskeletal: Normal range of motion.  Neurological: She is alert and oriented to person, place, and time.  Skin: Skin is warm and dry.  Incision to umbilicus, RLQ, and LLQ well approximated and healed. No edema, erythema, or drainage.  Psychiatric: She has a normal mood and affect.   Results for  orders placed or performed during the hospital encounter of 09/23/15 (from the past 24 hour(s))  Urinalysis, Routine w reflex microscopic (not at [facility])     Status: Abnormal   Collection Time: 09/23/15  8:30 PM  Result Value Ref Range   Color, Urine YELLOW YELLOW   APPearance CLEAR CLEAR   Specific Gravity, Urine >1.030 (H) 1.005 - 1.030   pH 5.5 5.0 - 8.0   Glucose, UA NEGATIVE NEGATIVE mg/dL   Hgb urine dipstick TRACE (A) NEGATIVE   Bilirubin Urine NEGATIVE NEGATIVE   Ketones, ur NEGATIVE NEGATIVE mg/dL   Protein, ur NEGATIVE NEGATIVE mg/dL   Nitrite NEGATIVE NEGATIVE   Leukocytes, UA NEGATIVE NEGATIVE  Urine microscopic-add on     Status: Abnormal   Collection Time: 09/23/15  8:30 PM  Result Value Ref Range   Squamous Epithelial / LPF 0-5 (A) NONE SEEN   WBC, UA NONE SEEN 0 - 5 WBC/hpf   RBC / HPF 0-5 0 - 5 RBC/hpf   Bacteria, UA RARE (A) NONE SEEN   Urine-Other MUCOUS PRESENT   Pregnancy, urine POC     Status: None   Collection Time: 09/23/15  9:00 PM  Result Value Ref Range   Preg Test, Ur NEGATIVE NEGATIVE  CBC     Status: Abnormal   Collection Time: 09/23/15  9:13 PM  Result Value Ref Range   WBC 7.3 4.0 - 10.5 K/uL   RBC 4.07 3.87 - 5.11 MIL/uL   Hemoglobin 11.6 (L) 12.0 - 15.0 g/dL   HCT 34.7 (L) 36.0 - 46.0 %   MCV 85.3 78.0 - 100.0 fL   MCH 28.5 26.0 - 34.0 pg   MCHC 33.4 30.0 - 36.0 g/dL   RDW 13.4 11.5 - 15.5 %   Platelets 275 150 - 400 K/uL  Comprehensive metabolic panel     Status: Abnormal   Collection Time: 09/23/15  9:13 PM  Result Value Ref Range   Sodium 137 135 - 145 mmol/L   Potassium 4.0 3.5 - 5.1 mmol/L   Chloride 105 101 - 111 mmol/L   CO2 25 22 - 32 mmol/L   Glucose, Bld 114 (H) 65 - 99 mg/dL   BUN 14 6 - 20 mg/dL   Creatinine, Ser 0.64 0.44 - 1.00 mg/dL   Calcium 9.3 8.9 - 10.3 mg/dL   Total Protein 7.4 6.5 - 8.1 g/dL   Albumin 3.9 3.5 - 5.0 g/dL   AST 17 15 - 41 U/L   ALT 14 14 - 54 U/L   Alkaline Phosphatase 52 38 - 126 U/L   Total  Bilirubin 0.3 0.3 - 1.2 mg/dL   GFR calc non Af Amer >60 >60 mL/min   GFR calc Af Amer >60 >60 mL/min   Anion gap 7 5 - 15  Amylase     Status: None   Collection Time: 09/23/15  9:13 PM  Result Value Ref Range   Amylase 63 28 - 100 U/L  Lipase, blood     Status:  Abnormal   Collection Time: 09/23/15  9:13 PM  Result Value Ref Range   Lipase 67 (H) 11 - 51 U/L   Dg Abd 1 View  Result Date: 09/23/2015 CLINICAL DATA:  BILATERAL upper quadrant abdominal pain, ulcerative colitis EXAM: ABDOMEN - 1 VIEW COMPARISON:  CT abdomen and pelvis 03/07/2006 FINDINGS: Normal bowel gas pattern. Food debris within stomach. Normal stool burden RIGHT colon. No bowel dilatation or definite bowel wall thickening. No urinary tract calcification. Osseous structures unremarkable. IMPRESSION: No acute abnormalities. Electronically Signed   By: Lavonia Dana M.D.   On: 09/23/2015 23:11    MAU Course  Procedures Reglan 10 mg po x1  MDM Labs and KUB ordered and reviewed. Pt reports some relief of nausea after Reglan. No vomiting episodes. Discussed findings with Dr. Raphael Gibney. Stable for discharge home  Assessment and Plan   1. Constipation, unspecified constipation type   2. Abdominal pain, bilateral upper quadrant   3. Nausea     Discharge home Reglan 10 mg po q6 hrs prn nausea #30, no refill Miralax 17g po daily prn constipation Follow up as scheduled in 1 week at [facility] Return for worsening sx  Julianne Handler, CNM 09/23/2015, 8:58 PM

## 2015-09-23 NOTE — MAU Note (Signed)
Correction, Pt had rt oophorectomy on 09-12-15.  Not a hysterectomy.  Pt C/O rectal bleeding with BM.  Pt states she was told she had hemorrhoid after a colonoscopy.

## 2015-09-23 NOTE — MAU Note (Addendum)
Only 09/12/15 had partial hysterectomy and removed R ovary. Lap assist vag. Since then I have had n/v. No diarrhea. Was constipated for awhile and I have had two BMS since the surgery which were normal. Had small BM today. Hx colitis. No vag bleeding. Occ has abdominal pain in upper abd which feels like "knot" but no pain currently

## 2015-09-23 NOTE — Discharge Instructions (Signed)

## 2015-09-23 NOTE — Progress Notes (Signed)
Pt comfortable laying on stretcher. No c/o at this time. Waiting on labs and xray

## 2015-12-29 ENCOUNTER — Other Ambulatory Visit: Payer: Self-pay | Admitting: Obstetrics and Gynecology

## 2015-12-29 ENCOUNTER — Other Ambulatory Visit (HOSPITAL_COMMUNITY)
Admission: RE | Admit: 2015-12-29 | Discharge: 2015-12-29 | Disposition: A | Discharge status: Home / Self Care | Payer: Medicaid Other | Source: Ambulatory Visit | Attending: Obstetrics and Gynecology | Admitting: Obstetrics and Gynecology

## 2015-12-29 DIAGNOSIS — Z113 Encounter for screening for infections with a predominantly sexual mode of transmission: Secondary | ICD-10-CM | POA: Diagnosis present

## 2015-12-29 DIAGNOSIS — Z01419 Encounter for gynecological examination (general) (routine) without abnormal findings: Secondary | ICD-10-CM | POA: Insufficient documentation

## 2016-01-02 LAB — CYTOLOGY - PAP
Chlamydia: NEGATIVE
DIAGNOSIS: NEGATIVE
Neisseria Gonorrhea: NEGATIVE

## 2017-04-16 IMAGING — MR MR THORACIC SPINE W/O CM
4 of 6 series · 19 of 48 positions shown · non-contrast
Comparison: None.

CLINICAL DATA: Thoracic spine pain for several years. Left clavicle
and left scapula burning pain.

EXAM:
MRI THORACIC SPINE WITHOUT CONTRAST
TECHNIQUE: Multiplanar, multisequence MR imaging of the thoracic spine was
performed. No intravenous contrast was administered.

[Series 11: T1 · sagittal · 3.0mm · 0.67mm/px · 3 of 15 slices shown]
[im 1/15]
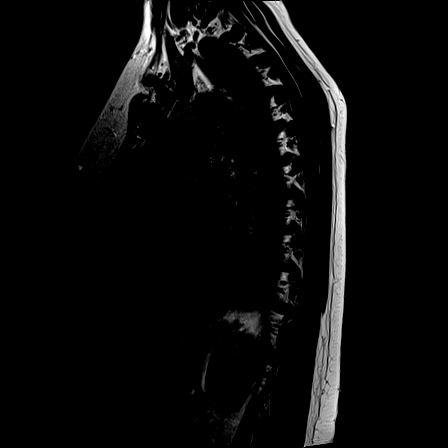
[im 8/15]
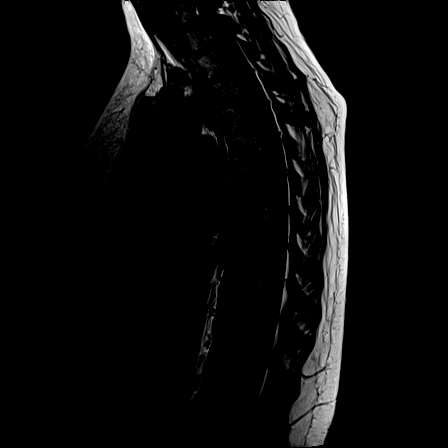
[im 15/15]
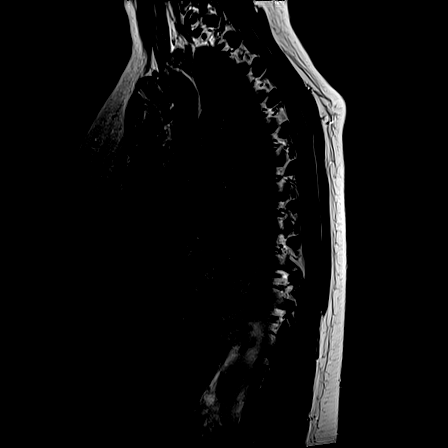

[Series 12: STIR · sagittal · 3.0mm · 0.67mm/px · 3 of 15 slices shown]
[im 1/15]
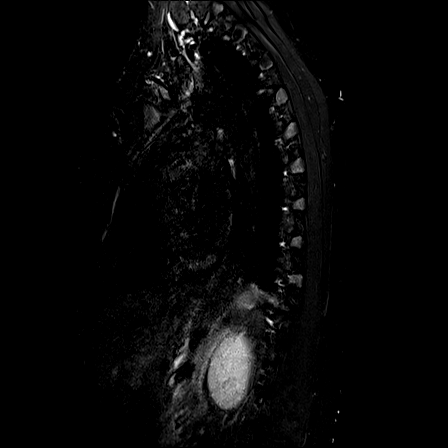
[im 8/15]
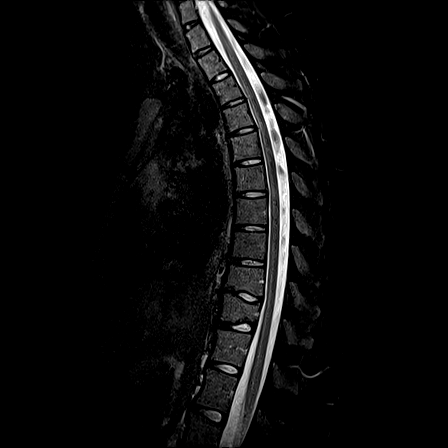
[im 15/15]
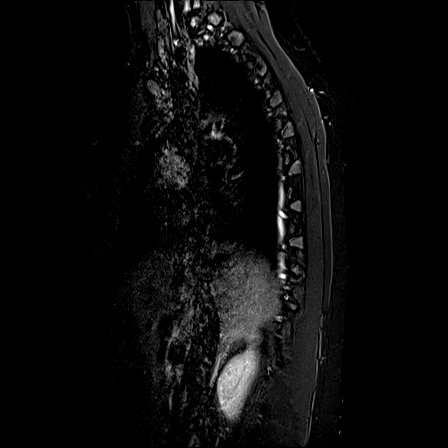

[Series 13: T2 · sagittal · 3.0mm · 0.67mm/px · 5 of 15 slices shown (1 of 2)]
[im 1/15]
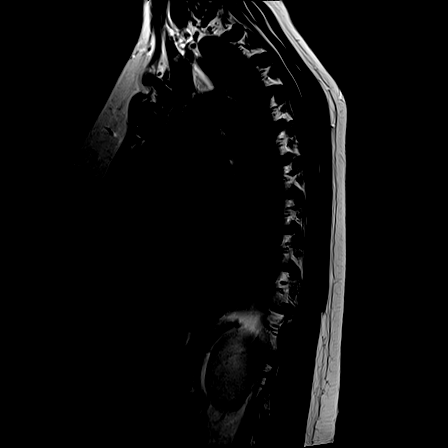
[im 4/15]
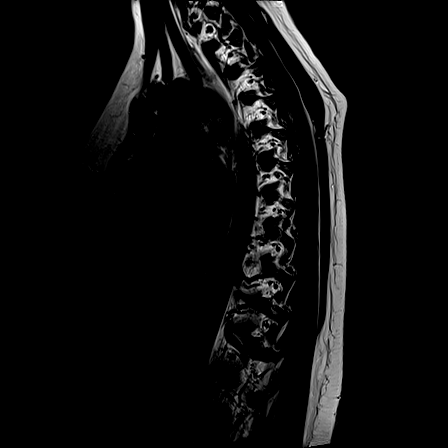
[im 8/15]
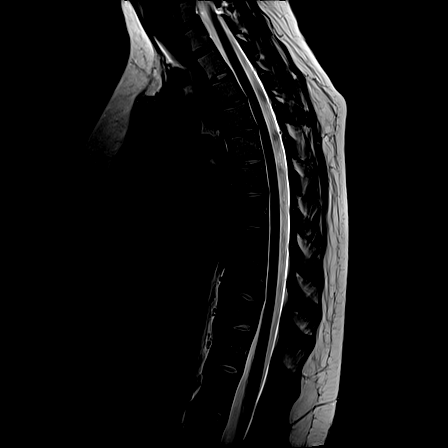
[im 11/15]
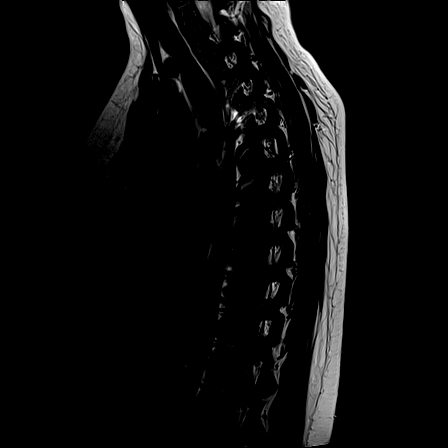
[im 15/15]
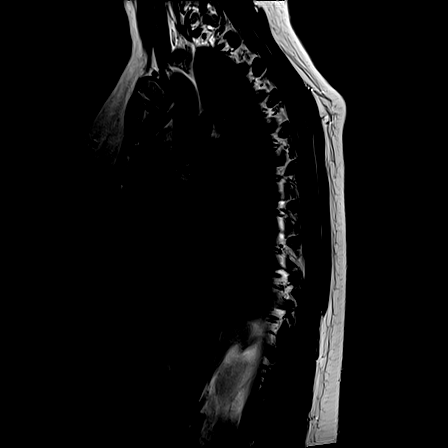

[Series 14: T2 · axial · 4.0mm · 0.28mm/px · z∈[-276,-93]mm · 8 of 38 slices shown (2 of 2)]
[im 1/38]
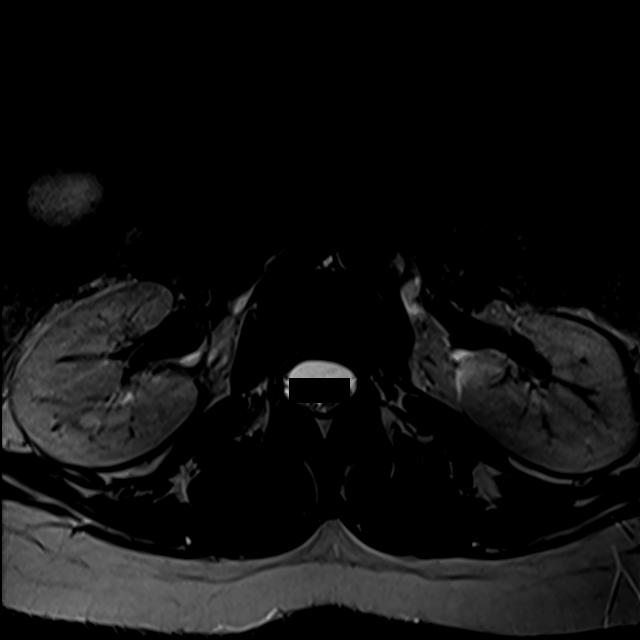
[im 6/38]
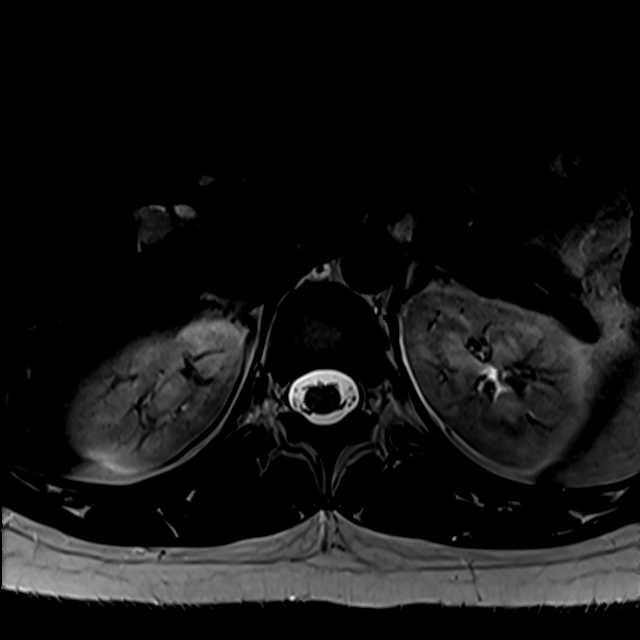
[im 12/38]
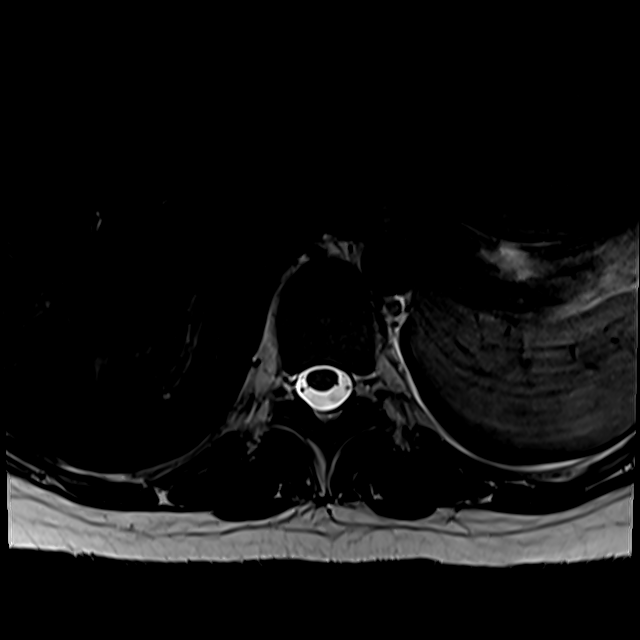
[im 18/38]
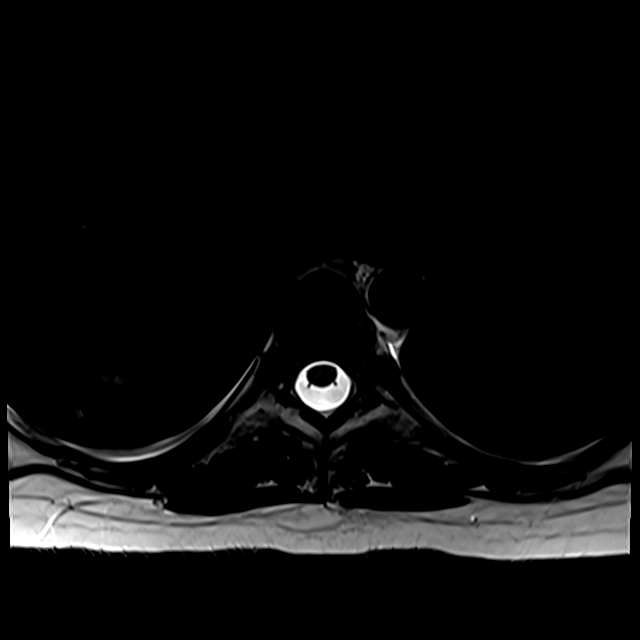
[im 20/38]
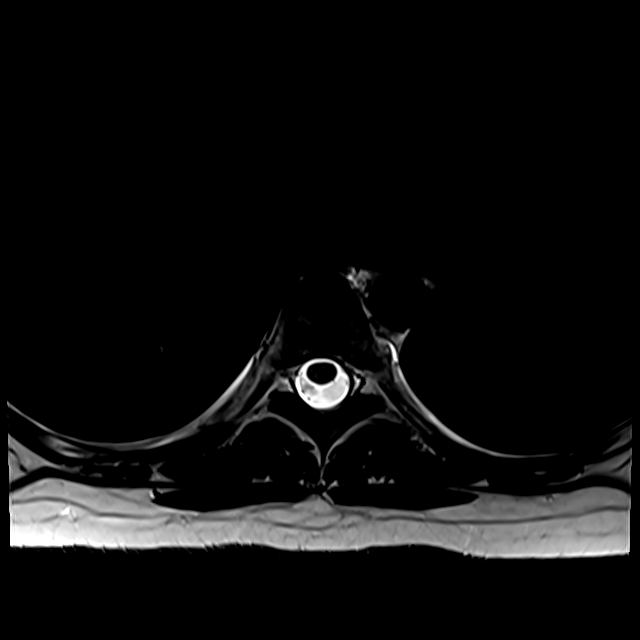
[im 26/38]
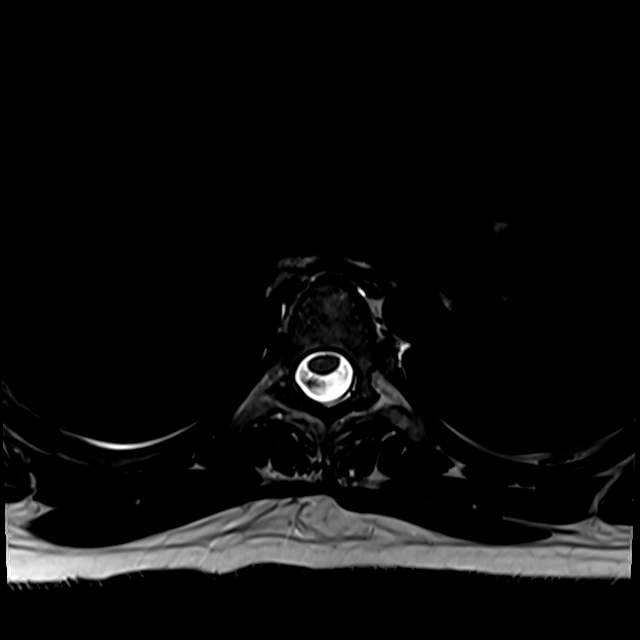
[im 32/38]
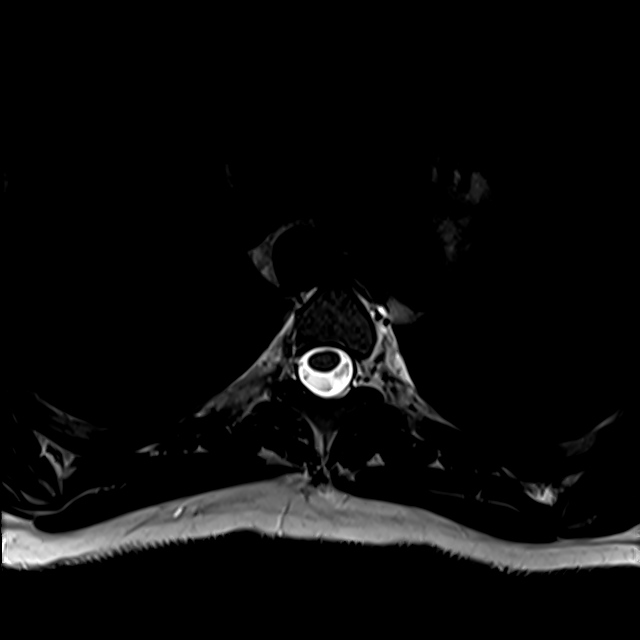
[im 38/38]
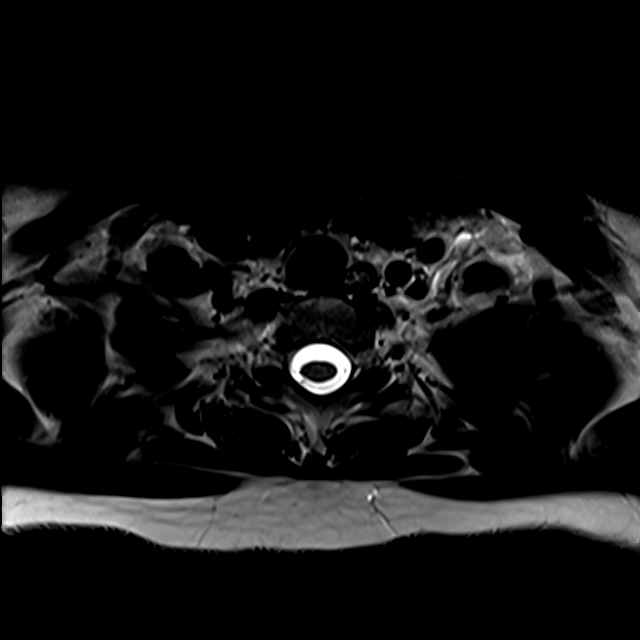

[19 of 48 positions shown; findings below may reference images not displayed]

FINDINGS: The thoracic spinal cord is normal. There is no mass lesion or
myelopathy or spinal cord compression. No foraminal or spinal
stenosis. No facet arthritis.

Paraspinal soft tissues are normal. Congenital slight butterfly
deformity of the T10 vertebral body, not significant.
IMPRESSION: Essentially normal MRI of the thoracic spine.

## 2017-08-24 IMAGING — CR DG ABDOMEN 1V
1 series · 1 of 1 positions shown · non-contrast
Comparison: CT abdomen and pelvis 03/07/2006

CLINICAL DATA: BILATERAL upper quadrant abdominal pain, ulcerative
colitis

EXAM:
ABDOMEN - 1 VIEW

[abdomen kub]
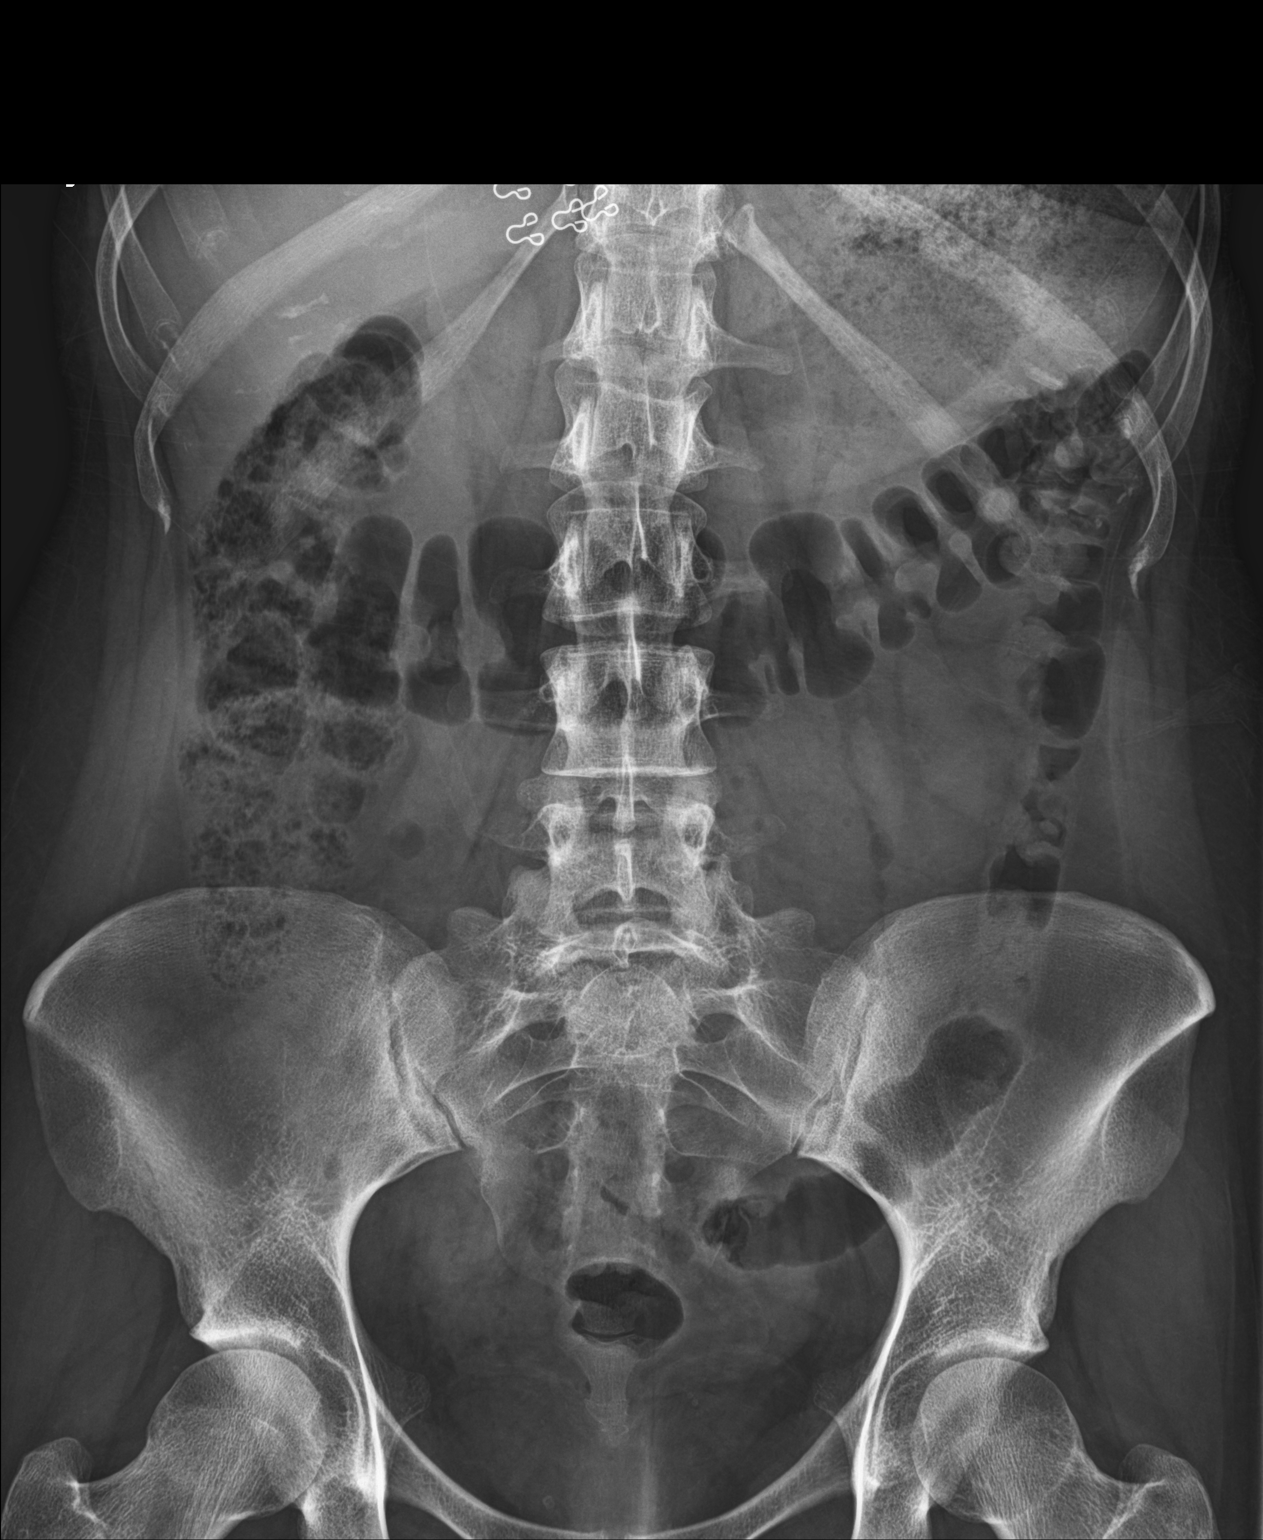

[1 of 1 positions shown; findings below may reference images not displayed]

FINDINGS: Normal bowel gas pattern.

Food debris within stomach.

Normal stool burden RIGHT colon.

No bowel dilatation or definite bowel wall thickening.

No urinary tract calcification.

Osseous structures unremarkable.
IMPRESSION: No acute abnormalities.

## 2020-09-08 LAB — OB RESULTS CONSOLE HEPATITIS B SURFACE ANTIGEN: Hepatitis B Surface Ag: NEGATIVE

## 2020-09-08 LAB — OB RESULTS CONSOLE ABO/RH: RH Type: POSITIVE

## 2020-09-08 LAB — OB RESULTS CONSOLE ANTIBODY SCREEN: Antibody Screen: NEGATIVE

## 2020-09-08 LAB — OB RESULTS CONSOLE HIV ANTIBODY (ROUTINE TESTING): HIV: NONREACTIVE

## 2020-09-08 LAB — HEPATITIS C ANTIBODY: HCV Ab: NEGATIVE

## 2020-09-08 LAB — OB RESULTS CONSOLE RPR: RPR: NONREACTIVE

## 2020-09-08 LAB — OB RESULTS CONSOLE RUBELLA ANTIBODY, IGM: Rubella: IMMUNE

## 2020-09-26 ENCOUNTER — Inpatient Hospital Stay (HOSPITAL_COMMUNITY)
Admission: AD | Admit: 2020-09-26 | Discharge: 2020-09-26 | Disposition: A | Discharge status: Home / Self Care | Payer: Medicaid Other | Attending: Obstetrics and Gynecology | Admitting: Obstetrics and Gynecology

## 2020-09-26 ENCOUNTER — Encounter (HOSPITAL_COMMUNITY): Payer: Self-pay | Admitting: Obstetrics and Gynecology

## 2020-09-26 ENCOUNTER — Other Ambulatory Visit: Payer: Self-pay

## 2020-09-26 DIAGNOSIS — O218 Other vomiting complicating pregnancy: Secondary | ICD-10-CM | POA: Diagnosis present

## 2020-09-26 DIAGNOSIS — Z3A15 15 weeks gestation of pregnancy: Secondary | ICD-10-CM

## 2020-09-26 DIAGNOSIS — K51819 Other ulcerative colitis with unspecified complications: Secondary | ICD-10-CM | POA: Diagnosis not present

## 2020-09-26 DIAGNOSIS — O99891 Other specified diseases and conditions complicating pregnancy: Secondary | ICD-10-CM | POA: Diagnosis not present

## 2020-09-26 DIAGNOSIS — R112 Nausea with vomiting, unspecified: Secondary | ICD-10-CM

## 2020-09-26 DIAGNOSIS — O99612 Diseases of the digestive system complicating pregnancy, second trimester: Secondary | ICD-10-CM

## 2020-09-26 DIAGNOSIS — K51919 Ulcerative colitis, unspecified with unspecified complications: Secondary | ICD-10-CM | POA: Insufficient documentation

## 2020-09-26 DIAGNOSIS — Z885 Allergy status to narcotic agent status: Secondary | ICD-10-CM | POA: Diagnosis not present

## 2020-09-26 DIAGNOSIS — Z881 Allergy status to other antibiotic agents status: Secondary | ICD-10-CM | POA: Diagnosis not present

## 2020-09-26 LAB — URINALYSIS, ROUTINE W REFLEX MICROSCOPIC
Bilirubin Urine: NEGATIVE
Glucose, UA: NEGATIVE mg/dL
Hgb urine dipstick: NEGATIVE
Ketones, ur: 20 mg/dL — AB
Leukocytes,Ua: NEGATIVE
Nitrite: NEGATIVE
Protein, ur: NEGATIVE mg/dL
Specific Gravity, Urine: 1.025 (ref 1.005–1.030)
pH: 5 (ref 5.0–8.0)

## 2020-09-26 LAB — COMPREHENSIVE METABOLIC PANEL
ALT: 14 U/L (ref 0–44)
AST: 19 U/L (ref 15–41)
Albumin: 3.3 g/dL — ABNORMAL LOW (ref 3.5–5.0)
Alkaline Phosphatase: 49 U/L (ref 38–126)
Anion gap: 9 (ref 5–15)
BUN: 8 mg/dL (ref 6–20)
CO2: 21 mmol/L — ABNORMAL LOW (ref 22–32)
Calcium: 9.5 mg/dL (ref 8.9–10.3)
Chloride: 102 mmol/L (ref 98–111)
Creatinine, Ser: 0.59 mg/dL (ref 0.44–1.00)
GFR, Estimated: >60 mL/min (ref >60)
Glucose, Bld: 89 mg/dL (ref 70–99)
Potassium: 3.6 mmol/L (ref 3.5–5.1)
Sodium: 132 mmol/L — ABNORMAL LOW (ref 135–145)
Total Bilirubin: 0.5 mg/dL (ref 0.3–1.2)
Total Protein: 6.9 g/dL (ref 6.5–8.1)

## 2020-09-26 LAB — CBC
HCT: 36.2 % (ref 36.0–46.0)
Hemoglobin: 12.1 g/dL (ref 12.0–15.0)
MCH: 29 pg (ref 26.0–34.0)
MCHC: 33.4 g/dL (ref 30.0–36.0)
MCV: 86.8 fL (ref 80.0–100.0)
Platelets: 319 10*3/uL (ref 150–400)
RBC: 4.17 MIL/uL (ref 3.87–5.11)
RDW: 14 % (ref 11.5–15.5)
WBC: 11.8 10*3/uL — ABNORMAL HIGH (ref 4.0–10.5)
nRBC: 0 % (ref 0.0–0.2)

## 2020-09-26 MED ORDER — LACTATED RINGERS IV BOLUS
1000.0000 mL | Freq: Once | INTRAVENOUS | Status: AC
  Administered 2020-09-26: 1000 mL via INTRAVENOUS

## 2020-09-26 MED ORDER — ONDANSETRON HCL 4 MG/2ML IJ SOLN
4.0000 mg | Freq: Once | INTRAMUSCULAR | Status: AC
  Administered 2020-09-26: 4 mg via INTRAVENOUS
  Filled 2020-09-26: qty 2

## 2020-09-26 MED ORDER — SODIUM CHLORIDE 0.9 % IV SOLN
25.0000 mg | Freq: Once | INTRAVENOUS | Status: AC
  Administered 2020-09-26: 25 mg via INTRAVENOUS
  Filled 2020-09-26: qty 1

## 2020-09-26 MED ORDER — PROMETHAZINE HCL 25 MG PO TABS: 25.0000 mg | ORAL_TABLET | Freq: Four times a day (QID) | ORAL | 2 refills | Status: DC | PRN

## 2020-09-26 MED ORDER — SODIUM CHLORIDE 0.9 % IV SOLN: 8.0000 mg | Freq: Once | INTRAVENOUS | Status: DC

## 2020-09-26 NOTE — MAU Provider Note (Signed)
History     CSN: [id number]  Arrival date and time: 09/26/20 [id number]   Event Date/Time   First Provider Initiated Contact with Patient 09/26/20 1004      Chief Complaint  Patient presents with   Nausea   Emesis   HPI This is a 25 year old G2 P1-0-0-1 at 15 weeks with a pregnancy complicated by ulcerative colitis who presents with nausea and vomiting for the past 3 days which has become steadily worse.  She is intolerant of both food and liquids, both of which make her vomiting worse.  She has tried diclegis, however this gave her an ulcerative colitis flare with rectal bleeding.  She has been trying to take Zofran and Pepcid for the past couple of days, which have not been helpful.  Her last dose of medications were last night.  She did vomit 4-5 times overnight.  Emesis described as bile and stomach contents.  No fever, chills, chest pain, SOB.  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   Dx'd by colonoscopy/biopsy   Complication of anesthesia    hard time waking up   Family history of adverse reaction to anesthesia    mother had a hard time of waking up   Inflammatory bowel disease     Past Surgical History:  Procedure Laterality Date   COLONOSCOPY W/ BIOPSIES  07-11-07   Colonoscopy/Biopsy, dx ulcerative proctitis   LAPAROSCOPIC OVARIAN CYSTECTOMY Right 09/12/2015   Procedure: RIGHT LAPAROSCOPIC OOPHORECTOMY;  Surgeon: Christophe Louis, MD;  Location: [facility];  Service: Gynecology;  Laterality: Right;  Possible Right Oophorectomy   LAPAROSCOPY N/A 09/12/2015   Procedure: LAPAROSCOPY DIAGNOSTIC;  Surgeon: Christophe Louis, MD;  Location: [facility];  Service: Gynecology;  Laterality: N/A;    Family History  Problem Relation Age of  Onset   Hypertension Paternal Grandmother    Diabetes Paternal Grandmother    Heart disease Paternal Grandfather    Inflammatory bowel disease Neg Hx     Social History   Tobacco Use   Smoking status: Never   Smokeless tobacco: Never  Substance Use Topics   Alcohol use: No   Drug use: No    Allergies:  Allergies  Allergen Reactions   Codeine Nausea Only   Doxycycline Nausea And Vomiting   Triamcinolone Hives and Swelling    Medications Prior to Admission  Medication Sig Dispense Refill Last Dose   acetaminophen (TYLENOL) 500 MG tablet 1-2 every 8 hours as needed for mild pain (Patient taking differently: Take 500-1,000 mg by mouth every 8 (eight) hours as needed for moderate pain or headache. 1-2 every 8 hours as needed for mild pain) 30 tablet 0    APRISO 0.375 g 24 hr capsule Take 1.5 g by mouth daily. Reported on 06/15/2015  11    metoCLOPramide (REGLAN) 10 MG tablet Take 1 tablet (10 mg total) by mouth every 6 (six) hours as needed for nausea. 30 tablet 0  norgestimate-ethinyl estradiol (SPRINTEC 28) 0.25-35 MG-MCG tablet Take 1 tablet by mouth daily.      polyethylene glycol (MIRALAX) packet Take 17 g by mouth daily.       Review of Systems  All other systems reviewed and are negative. Physical Exam   Blood pressure 111/70, pulse 97, temperature 98.2 F (36.8 C), temperature source Oral, resp. rate 16, height '5\' 3"'$  (1.6 m), weight 80.2 kg, last menstrual period 06/13/2020, SpO2 97 %.  Physical Exam Vitals reviewed.  Constitutional:      Appearance: Normal appearance.  HENT:     Head: Normocephalic and atraumatic.  Cardiovascular:     Rate and Rhythm: Normal rate and regular rhythm.  Pulmonary:     Effort: Pulmonary effort is normal.     Breath sounds: Normal breath sounds.  Abdominal:     General: Abdomen is flat. There is no distension.     Palpations: Abdomen is soft. There is no mass.     Tenderness: There is no abdominal tenderness. There is no guarding  or rebound.     Hernia: No hernia is present.  Skin:    General: Skin is warm and dry.     Capillary Refill: Capillary refill takes less than 2 seconds.  Neurological:     General: No focal deficit present.     Mental Status: She is alert.  Psychiatric:        Mood and Affect: Mood normal.        Behavior: Behavior normal.        Thought Content: Thought content normal.   Results for orders placed or performed during the hospital encounter of 09/26/20 (from the past 24 hour(s))  Comprehensive metabolic panel     Status: Abnormal   Collection Time: 09/26/20 10:05 AM  Result Value Ref Range   Sodium 132 (L) 135 - 145 mmol/L   Potassium 3.6 3.5 - 5.1 mmol/L   Chloride 102 98 - 111 mmol/L   CO2 21 (L) 22 - 32 mmol/L   Glucose, Bld 89 70 - 99 mg/dL   BUN 8 6 - 20 mg/dL   Creatinine, Ser 0.59 0.44 - 1.00 mg/dL   Calcium 9.5 8.9 - 10.3 mg/dL   Total Protein 6.9 6.5 - 8.1 g/dL   Albumin 3.3 (L) 3.5 - 5.0 g/dL   AST 19 15 - 41 U/L   ALT 14 0 - 44 U/L   Alkaline Phosphatase 49 38 - 126 U/L   Total Bilirubin 0.5 0.3 - 1.2 mg/dL   GFR, Estimated >60 >60 mL/min   Anion gap 9 5 - 15  CBC     Status: Abnormal   Collection Time: 09/26/20 10:05 AM  Result Value Ref Range   WBC 11.8 (H) 4.0 - 10.5 K/uL   RBC 4.17 3.87 - 5.11 MIL/uL   Hemoglobin 12.1 12.0 - 15.0 g/dL   HCT 36.2 36.0 - 46.0 %   MCV 86.8 80.0 - 100.0 fL   MCH 29.0 26.0 - 34.0 pg   MCHC 33.4 30.0 - 36.0 g/dL   RDW 14.0 11.5 - 15.5 %   Platelets 319 150 - 400 K/uL   nRBC 0.0 0.0 - 0.2 %  Urinalysis, Routine w reflex microscopic Urine, Clean Catch     Status: Abnormal   Collection Time: 09/26/20 10:14 AM  Result Value Ref Range   Color, Urine YELLOW YELLOW   APPearance CLEAR CLEAR   Specific Gravity, Urine 1.025 1.005 - 1.030   pH 5.0 5.0 -  8.0   Glucose, UA NEGATIVE NEGATIVE mg/dL   Hgb urine dipstick NEGATIVE NEGATIVE   Bilirubin Urine NEGATIVE NEGATIVE   Ketones, ur 20 (A) NEGATIVE mg/dL   Protein, ur NEGATIVE  NEGATIVE mg/dL   Nitrite NEGATIVE NEGATIVE   Leukocytes,Ua NEGATIVE NEGATIVE     MAU Course  Procedures  MDM 2L LR given Zofran '4mg'$  given without improvement. Phenergan '25mg'$  given with improvement  Assessment and Plan   1. [redacted] weeks gestation of pregnancy   2. Non-intractable vomiting with nausea, unspecified vomiting type   3. Other ulcerative colitis with complication [facility])    Discharge to home with phenergan. F/u with [facility].  Truett Mainland 09/26/2020, 10:04 AM

## 2020-09-26 NOTE — MAU Note (Signed)
[name] [name] is a 25 y.o. at 86w0dhere in MAU reporting: has been struggling with nausea and vomiting throughout pregnancy. Was taking diclegis but it was causing a UC flare so she stopped. Has been taking zofran and pepcid with no relief. Has been rx phenergan but did not take it due to codeine allergy. Reports about 8 episodes of vomiting in the past 24 hours.  Onset of complaint: ongoing  Pain score: 0/10  Vitals:   09/26/20 0941  BP: 117/80  Pulse: 93  Resp: 16  Temp: 98.2 F (36.8 C)  SpO2: 97%     FHT:166  Lab orders placed from triage: UA

## 2020-10-07 ENCOUNTER — Other Ambulatory Visit: Payer: Self-pay

## 2020-10-18 ENCOUNTER — Inpatient Hospital Stay (HOSPITAL_COMMUNITY)
Admission: AD | Admit: 2020-10-18 | Discharge: 2020-10-19 | Disposition: A | Discharge status: Home / Self Care | Payer: Medicaid Other | Attending: Obstetrics and Gynecology | Admitting: Obstetrics and Gynecology

## 2020-10-18 ENCOUNTER — Encounter (HOSPITAL_COMMUNITY): Payer: Self-pay | Admitting: Obstetrics and Gynecology

## 2020-10-18 DIAGNOSIS — R55 Syncope and collapse: Secondary | ICD-10-CM

## 2020-10-18 DIAGNOSIS — R42 Dizziness and giddiness: Secondary | ICD-10-CM | POA: Insufficient documentation

## 2020-10-18 DIAGNOSIS — O99282 Endocrine, nutritional and metabolic diseases complicating pregnancy, second trimester: Secondary | ICD-10-CM | POA: Diagnosis not present

## 2020-10-18 DIAGNOSIS — O99891 Other specified diseases and conditions complicating pregnancy: Secondary | ICD-10-CM | POA: Diagnosis not present

## 2020-10-18 DIAGNOSIS — Z3A18 18 weeks gestation of pregnancy: Secondary | ICD-10-CM | POA: Diagnosis not present

## 2020-10-18 DIAGNOSIS — E860 Dehydration: Secondary | ICD-10-CM | POA: Diagnosis not present

## 2020-10-18 LAB — URINALYSIS, ROUTINE W REFLEX MICROSCOPIC
Bacteria, UA: NONE SEEN
Bilirubin Urine: NEGATIVE
Glucose, UA: NEGATIVE mg/dL
Hgb urine dipstick: NEGATIVE
Ketones, ur: NEGATIVE mg/dL
Nitrite: NEGATIVE
Protein, ur: NEGATIVE mg/dL
Specific Gravity, Urine: 1.028 (ref 1.005–1.030)
pH: 5 (ref 5.0–8.0)

## 2020-10-18 LAB — CBC WITH DIFFERENTIAL/PLATELET
Abs Immature Granulocytes: 0.04 10*3/uL (ref 0.00–0.07)
Basophils Absolute: 0 10*3/uL (ref 0.0–0.1)
Basophils Relative: 0 %
Eosinophils Absolute: 0.2 10*3/uL (ref 0.0–0.5)
Eosinophils Relative: 2 %
HCT: 33.9 % — ABNORMAL LOW (ref 36.0–46.0)
Hemoglobin: 11.2 g/dL — ABNORMAL LOW (ref 12.0–15.0)
Immature Granulocytes: 0 %
Lymphocytes Relative: 19 %
Lymphs Abs: 1.9 10*3/uL (ref 0.7–4.0)
MCH: 28.6 pg (ref 26.0–34.0)
MCHC: 33 g/dL (ref 30.0–36.0)
MCV: 86.7 fL (ref 80.0–100.0)
Monocytes Absolute: 0.8 10*3/uL (ref 0.1–1.0)
Monocytes Relative: 8 %
Neutro Abs: 7 10*3/uL (ref 1.7–7.7)
Neutrophils Relative %: 71 %
Platelets: 312 10*3/uL (ref 150–400)
RBC: 3.91 MIL/uL (ref 3.87–5.11)
RDW: 13.4 % (ref 11.5–15.5)
WBC: 10 10*3/uL (ref 4.0–10.5)
nRBC: 0 % (ref 0.0–0.2)

## 2020-10-18 LAB — COMPREHENSIVE METABOLIC PANEL
ALT: 12 U/L (ref 0–44)
AST: 14 U/L — ABNORMAL LOW (ref 15–41)
Albumin: 3 g/dL — ABNORMAL LOW (ref 3.5–5.0)
Alkaline Phosphatase: 54 U/L (ref 38–126)
Anion gap: 5 (ref 5–15)
BUN: 8 mg/dL (ref 6–20)
CO2: 25 mmol/L (ref 22–32)
Calcium: 9.4 mg/dL (ref 8.9–10.3)
Chloride: 104 mmol/L (ref 98–111)
Creatinine, Ser: 0.54 mg/dL (ref 0.44–1.00)
GFR, Estimated: >60 mL/min (ref >60)
Glucose, Bld: 78 mg/dL (ref 70–99)
Potassium: 3.9 mmol/L (ref 3.5–5.1)
Sodium: 134 mmol/L — ABNORMAL LOW (ref 135–145)
Total Bilirubin: 0.3 mg/dL (ref 0.3–1.2)
Total Protein: 6.5 g/dL (ref 6.5–8.1)

## 2020-10-18 MED ORDER — LACTATED RINGERS IV BOLUS
1000.0000 mL | Freq: Once | INTRAVENOUS | Status: AC
  Administered 2020-10-18: 1000 mL via INTRAVENOUS

## 2020-10-18 NOTE — MAU Note (Signed)
.  [name] [name] is a 25 y.o. at 42w1dhere in MAU reporting: patient has been blacking out since yesterday. States it has happened 5x in the past 24 hours. She did passed out and fell to the floor last night but did not her head. She has a headache she rates 6/10 and is seeing spots. Denies VB or LOF. Sometimes she has thrown up before blacking out.   Pain score: 6 Vitals:   10/18/20 1742  BP: 113/68  Pulse: 100  Resp: 15  Temp: 99.3 F (37.4 C)     FHT:137 Lab orders placed from triage:  UA

## 2020-10-18 NOTE — MAU Provider Note (Addendum)
History     CSN: [id number]  Arrival date and time: 10/18/20 1702   Event Date/Time   First Provider Initiated Contact with Patient 10/18/20 1936     HPI  [name] [name] is a 25 y.o. female G2P1001 @ 20w1dhere in MAU with complaints of dizziness and lightheadedness. The symptoms have been present for a couple of months off and on. No chest pain. The episodes are very random. Yesterday she felt strange and kept having to lay down because of the symptoms.  She can feel the symptoms come on. She normally starts to feeling numbing in her face and lips. The last time she passed out was last night while she was walking to the kitchen from her bedroom. This was around 11:00 pm. She was siting for a while then got up to go to bedroom. She had eaten last around 9 pm, she had eaten a hot dog. She had dinner around 1800 then she threw up. At that time she ate a hamburger and hotdog. For lunch she ate a bagel. For breakfast she ate raisin brand.   She denies pain.   OB History     Gravida  2   Para  1   Term  1   Preterm      AB      Living  1      SAB      IAB      Ectopic      Multiple      Live Births  1           Past Medical History:  Diagnosis Date   Anemia    Anxiety    Chronic ulcerative proctitis with rectal bleeding ([facility] 07-11-07   Dx'd by colonoscopy/biopsy   Complication of anesthesia    hard time waking up   Family history of adverse reaction to anesthesia    mother had a hard time of waking up   Inflammatory bowel disease     Past Surgical History:  Procedure Laterality Date   COLONOSCOPY W/ BIOPSIES  07-11-07   Colonoscopy/Biopsy, dx ulcerative proctitis   LAPAROSCOPIC OVARIAN CYSTECTOMY Right 09/12/2015   Procedure: RIGHT LAPAROSCOPIC OOPHORECTOMY;  Surgeon: TChristophe Louis MD;  [facility];  Service: Gynecology;  Laterality: Right;  Possible Right Oophorectomy   LAPAROSCOPY N/A 09/12/2015   Procedure: LAPAROSCOPY DIAGNOSTIC;  Surgeon: TChristophe Louis MD;  [facility];  Service: Gynecology;  Laterality: N/A;    Family History  Problem Relation Age of Onset   Hypertension Paternal Grandmother    Diabetes Paternal Grandmother    Heart disease Paternal Grandfather    Inflammatory bowel disease Neg Hx     Social History   Tobacco Use   Smoking status: Never   Smokeless tobacco: Never  Substance Use Topics   Alcohol use: No   Drug use: No    Allergies:  Allergies  Allergen Reactions   Codeine Nausea Only   Doxycycline Nausea And Vomiting   Triamcinolone Hives and Swelling    Medications Prior to Admission  Medication Sig Dispense Refill Last Dose   acetaminophen (TYLENOL) 500 MG tablet 1-2 every 8 hours as needed for mild pain (Patient taking differently: Take 500-1,000 mg by mouth every 8 (eight) hours as needed for moderate pain or headache. 1-2 every 8 hours as needed for mild pain) 30 tablet 0 10/17/2020   famotidine (PEPCID) 20 MG tablet Take 20 mg by mouth 2 (two) times daily.   10/18/2020  ondansetron (ZOFRAN) 8 MG tablet Take 8 mg by mouth every 8 (eight) hours as needed for nausea or vomiting.   10/18/2020   Prenatal Vit-Fe Fumarate-FA (MULTIVITAMIN-PRENATAL) 27-0.8 MG TABS tablet Take 1 tablet by mouth daily at 12 noon.   10/18/2020   promethazine (PHENERGAN) 25 MG tablet Take 1 tablet (25 mg total) by mouth every 6 (six) hours as needed for nausea or vomiting. 30 tablet 2    Results for orders placed or performed during the hospital encounter of 10/18/20 (from the past 48 hour(s))  CBC with Differential/Platelet     Status: Abnormal   Collection Time: 10/18/20  5:44 PM  Result Value Ref Range   WBC 10.0 4.0 - 10.5 K/uL   RBC 3.91 3.87 - 5.11 MIL/uL   Hemoglobin 11.2 (L) 12.0 - 15.0 g/dL   HCT 33.9 (L) 36.0 - 46.0 %   MCV 86.7 80.0 - 100.0 fL   MCH 28.6 26.0 - 34.0 pg   MCHC 33.0 30.0 - 36.0 g/dL   RDW 13.4 11.5 - 15.5 %   Platelets 312 150 - 400 K/uL   nRBC 0.0 0.0 - 0.2 %   Neutrophils Relative % 71 %    Neutro Abs 7.0 1.7 - 7.7 K/uL   Lymphocytes Relative 19 %   Lymphs Abs 1.9 0.7 - 4.0 K/uL   Monocytes Relative 8 %   Monocytes Absolute 0.8 0.1 - 1.0 K/uL   Eosinophils Relative 2 %   Eosinophils Absolute 0.2 0.0 - 0.5 K/uL   Basophils Relative 0 %   Basophils Absolute 0.0 0.0 - 0.1 K/uL   Immature Granulocytes 0 %   Abs Immature Granulocytes 0.04 0.00 - 0.07 K/uL    Comment: Performed at [facility], [address], [place], [place] [address]  Comprehensive metabolic panel     Status: Abnormal   Collection Time: 10/18/20  5:44 PM  Result Value Ref Range   Sodium 134 (L) 135 - 145 mmol/L   Potassium 3.9 3.5 - 5.1 mmol/L   Chloride 104 98 - 111 mmol/L   CO2 25 22 - 32 mmol/L   Glucose, Bld 78 70 - 99 mg/dL    Comment: Glucose reference range applies only to samples taken after fasting for at least 8 hours.   BUN 8 6 - 20 mg/dL   Creatinine, Ser 0.54 0.44 - 1.00 mg/dL   Calcium 9.4 8.9 - 10.3 mg/dL   Total Protein 6.5 6.5 - 8.1 g/dL   Albumin 3.0 (L) 3.5 - 5.0 g/dL   AST 14 (L) 15 - 41 U/L   ALT 12 0 - 44 U/L   Alkaline Phosphatase 54 38 - 126 U/L   Total Bilirubin 0.3 0.3 - 1.2 mg/dL    Comment: RESULT REPEATED AND VERIFIED   GFR, Estimated >60 >60 mL/min    Comment: (NOTE) Calculated using the CKD-EPI Creatinine Equation (2021)    Anion gap 5 5 - 15    Comment: Performed at [facility], [place] [address] [place],  [place] [address]  Urinalysis, Routine w reflex microscopic Urine, Clean Catch     Status: Abnormal   Collection Time: 10/18/20  6:22 PM  Result Value Ref Range   Color, Urine YELLOW YELLOW   APPearance HAZY (A) CLEAR   Specific Gravity, Urine 1.028 1.005 - 1.030   pH 5.0 5.0 - 8.0   Glucose, UA NEGATIVE NEGATIVE mg/dL   Hgb urine dipstick NEGATIVE NEGATIVE   Bilirubin Urine NEGATIVE NEGATIVE  Ketones, ur NEGATIVE NEGATIVE mg/dL   Protein, ur NEGATIVE NEGATIVE mg/dL   Nitrite NEGATIVE NEGATIVE   Leukocytes,Ua SMALL (A) NEGATIVE   RBC / HPF  0-5 0 - 5 RBC/hpf   WBC, UA 0-5 0 - 5 WBC/hpf   Bacteria, UA NONE SEEN NONE SEEN   Squamous Epithelial / LPF 0-5 0 - 5   Mucus PRESENT    Ca Oxalate Crys, UA PRESENT     Comment: Performed at [facility] [address], [place], [place] [address]    Review of Systems  Neurological:  Positive for dizziness, syncope and light-headedness. Negative for speech difficulty, weakness, numbness and headaches.  Physical Exam   Blood pressure 102/72, pulse (!) 106, temperature 99.3 F (37.4 C), temperature source Oral, resp. rate 15, last menstrual period 06/13/2020, SpO2 97 %.  Physical Exam Vitals and nursing note reviewed.  Constitutional:      General: She is not in acute distress.    Appearance: Normal appearance. She is not ill-appearing, toxic-appearing or diaphoretic.  HENT:     Head: Normocephalic.  Cardiovascular:     Rate and Rhythm: Normal rate and regular rhythm.  Musculoskeletal:     Cervical back: Neck supple.  Skin:    General: Skin is warm.  Neurological:     Mental Status: She is alert and oriented to person, place, and time.     GCS: GCS eye subscore is 4. GCS verbal subscore is 5. GCS motor subscore is 6.     Coordination: Coordination is intact.  Psychiatric:        Behavior: Behavior normal.   MAU Course  Procedures None  MDM  + fetal heart tones via doppler  Discussed diet: limited protein with high sugar. Also midly dehydrated LR bolus x 1 Orthostatic vitals post infusion: EKG shows NSR Cardiology information provided to patient. Referral sent.  Report given to Hansel Feinstein CNM who resumes care of the patient.   Rasch, Anderson Malta I, NP 10/18/2020  Orthostatic VS done after infusion were still indicating elevated HR with position change So We infused a second liter of fluids. Her VS remained essentially similar but patient felt much better. She no longer had dizziness or weakness with standing.  She expressed desire for discharge home Long  discussion of diet and hydration   Assessment and Plan  A:  Single IUP at [redacted]w[redacted]d      Dehydration with orthostatic changes       Poor fluid and food intake   P:  Discharge home      Advised on regular food and hydration to prevent orthostasis       Recommend support stockings daily      Advised to followup with [facility]       Encouraged to return if she develops worsening of symptoms, increase in pain, fever, or other concerning symptoms.   WSeabron Spates CNM

## 2020-10-19 DIAGNOSIS — T8859XA Other complications of anesthesia, initial encounter: Secondary | ICD-10-CM | POA: Insufficient documentation

## 2020-10-19 DIAGNOSIS — K529 Noninfective gastroenteritis and colitis, unspecified: Secondary | ICD-10-CM | POA: Insufficient documentation

## 2020-10-19 DIAGNOSIS — Z8489 Family history of other specified conditions: Secondary | ICD-10-CM | POA: Insufficient documentation

## 2020-10-19 DIAGNOSIS — D649 Anemia, unspecified: Secondary | ICD-10-CM | POA: Insufficient documentation

## 2020-10-19 LAB — CULTURE, OB URINE

## 2020-10-20 ENCOUNTER — Ambulatory Visit (INDEPENDENT_AMBULATORY_CARE_PROVIDER_SITE_OTHER): Payer: Medicaid Other | Admitting: Cardiology

## 2020-10-20 ENCOUNTER — Other Ambulatory Visit: Payer: Self-pay

## 2020-10-20 ENCOUNTER — Ambulatory Visit (INDEPENDENT_AMBULATORY_CARE_PROVIDER_SITE_OTHER): Payer: Medicaid Other

## 2020-10-20 ENCOUNTER — Encounter: Payer: Self-pay | Admitting: Cardiology

## 2020-10-20 VITALS — Ht 63.0 in | Wt 178.0 lb

## 2020-10-20 DIAGNOSIS — R55 Syncope and collapse: Secondary | ICD-10-CM | POA: Diagnosis not present

## 2020-10-20 DIAGNOSIS — R002 Palpitations: Secondary | ICD-10-CM

## 2020-10-20 DIAGNOSIS — I951 Orthostatic hypotension: Secondary | ICD-10-CM

## 2020-10-20 NOTE — Patient Instructions (Addendum)
Medication Instructions:  Your physician recommends that you continue on your current medications as directed. Please refer to the Current Medication list given to you today.  *If you need a refill on your cardiac medications before your next appointment, please call your pharmacy*   Lab Work: None If you have labs (blood work) drawn today and your tests are completely normal, you will receive your results only by: [place] (if you have MyChart) OR A paper copy in the mail If you have any lab test that is abnormal or we need to change your treatment, we will call you to review the results.   Testing/Procedures: Your physician has requested that you have an echocardiogram. Echocardiography is a painless test that uses sound waves to create images of your heart. It provides your doctor with information about the size and shape of your heart and how well your heart's chambers and valves are working. This procedure takes approximately one hour. There are no restrictions for this procedure.  A zio monitor was ordered today. It will remain on for 14 days. You will then return monitor and event diary in provided box. It takes 1-2 weeks for report to be downloaded and returned to [place]. We will call you with the results. If monitor falls off or has orange flashing light, please call Zio for further instructions.   ZIO  WHY IS MY DOCTOR PRESCRIBING ZIO? The Zio system is proven and trusted by physicians to detect and diagnose irregular heart rhythms -- and has been prescribed to hundreds of thousands of patients.  The FDA has cleared the Zio system to monitor for many different kinds of irregular heart rhythms. In a study, physicians were able to reach a diagnosis 90% of the time with the Zio system1.  You can wear the Zio monitor -- a small, discreet, comfortable patch -- during your normal day-to-day activity, including while you sleep, shower, and exercise, while it records every single  heartbeat for analysis.  1Barrett, P., et al. Comparison of 24 Hour Holter Monitoring Versus 14 Day Novel Adhesive Patch Electrocardiographic Monitoring. [employer]its, 2014.  ZIO VS. HOLTER MONITORING The Zio monitor can be comfortably worn for up to 14 days. Holter monitors can be worn for 24 to 48 hours, limiting the time to record any irregular heart rhythms you may have. Zio is able to capture data for the 51% of patients who have their first symptom-triggered arrhythmia after 48 hours.1  LIVE WITHOUT RESTRICTIONS The Zio ambulatory cardiac monitor is a small, unobtrusive, and water-resistant patch--you might even forget you're wearing it. The Zio monitor records and stores every beat of your heart, whether you're sleeping, working out, or showering. Remove 12 days after applying.     Follow-Up: At [facility], you and your health needs are our priority.  As part of our continuing mission to provide you with exceptional heart care, we have created designated Provider Care Teams.  These Care Teams include your primary Cardiologist (physician) and Advanced Practice Providers (APPs -  Physician Assistants and Nurse Practitioners) who all work together to provide you with the care you need, when you need it.  We recommend signing up for the patient portal called "MyChart".  Sign up information is provided on this After Visit Summary.  MyChart is used to connect with patients for Virtual Visits (Telemedicine).  Patients are able to view lab/test results, encounter notes, upcoming appointments, etc.  Non-urgent messages can be sent to your provider as well.  To learn more about what you can do with MyChart, go to [email or web address].    Your next appointment:   4-6 week(s)  The format for your next appointment:   In Person  Provider:   Berniece Salines, DO [address], [place], [place] [address]    Other Instructions 1. Please increase fluid intake, can use  Gatorade as well.  2. Compression socks/stockings. Please wear compression sock/stockings daily (10 psi or greater) 3. It's okay to increase salt intake slightly.   Echocardiogram An echocardiogram is a test that uses sound waves (ultrasound) to produce images of the heart. Images from an echocardiogram can provide important information about: Heart size and shape. The size and thickness and movement of your heart's walls. Heart muscle function and strength. Heart valve function or if you have stenosis. Stenosis is when the heart valves are too narrow. If blood is flowing backward through the heart valves (regurgitation). A tumor or infectious growth around the heart valves. Areas of heart muscle that are not working well because of poor blood flow or injury from a heart attack. Aneurysm detection. An aneurysm is a weak or damaged part of an artery wall. The wall bulges out from the normal force of blood pumping through the body. Tell a health care provider about: Any allergies you have. All medicines you are taking, including vitamins, herbs, eye drops, creams, and over-the-counter medicines. Any blood disorders you have. Any surgeries you have had. Any medical conditions you have. Whether you are pregnant or may be pregnant. What are the risks? Generally, this is a safe test. However, problems may occur, including an allergic reaction to dye (contrast) that may be used during the test. What happens before the test? No specific preparation is needed. You may eat and drink normally. What happens during the test?  You will take off your clothes from the waist up and put on a hospital gown. Electrodes or electrocardiogram (ECG)patches may be placed on your chest. The electrodes or patches are then connected to a device that monitors your heart rate and rhythm. You will lie down on a table for an ultrasound exam. A gel will be applied to your chest to help sound waves pass through your  skin. A handheld device, called a transducer, will be pressed against your chest and moved over your heart. The transducer produces sound waves that travel to your heart and bounce back (or "echo" back) to the transducer. These sound waves will be captured in real-time and changed into images of your heart that can be viewed on a video monitor. The images will be recorded on a computer and reviewed by your health care provider. You may be asked to change positions or hold your breath for a short time. This makes it easier to get different views or better views of your heart. In some cases, you may receive contrast through an IV in one of your veins. This can improve the quality of the pictures from your heart. The procedure may vary among health care providers and hospitals. What can I expect after the test? You may return to your normal, everyday life, including diet, activities, and medicines, unless your health care provider tells you not to do that. Follow these instructions at home: It is up to you to get the results of your test. Ask your health care provider, or the department that is doing the test, when your results will be ready. Keep all follow-up visits. This is important. Summary An echocardiogram  is a test that uses sound waves (ultrasound) to produce images of the heart. Images from an echocardiogram can provide important information about the size and shape of your heart, heart muscle function, heart valve function, and other possible heart problems. You do not need to do anything to prepare before this test. You may eat and drink normally. After the echocardiogram is completed, you may return to your normal, everyday life, unless your health care provider tells you not to do that. This information is not intended to replace advice given to you by your health care provider. Make sure you discuss any questions you have with your health care provider. Document Revised: 09/22/2019  Document Reviewed: 09/22/2019 Elsevier Patient Education  2022 [employer].

## 2020-10-21 NOTE — Progress Notes (Signed)
Cardio-Obstetrics Clinic  New Evaluation  Date:  10/21/2020   ID:  [name] [name], DOB 02-13-1995, MRN [id number]  PCP:  Harlan Stains, MD   [facility] Providers Cardiologist:  None  Electrophysiologist:  None       Referring MD: Lezlie Lye, NP   Chief Complaint: I passed out several times  History of Present Illness:    [name] [name] is a 25 y.o. female [G2P1001] who is being seen today for the evaluation of syncope at the request of Rasch, Artist Pais, NP.   Medical history includes anxiety, GERD and tells me that she has been experiencing significant vomiting during this pregnancy.  She notes that for the last several weeks she has not been able to keep anything down and she has been frozen with vomiting.  She has had several ER visits where she had been giving IV fluids.  She takes Zofran which helps sometimes but not always.  In addition she notes that she has some intermittent palpitations.  She tells me that she has had an episode where she had abrupt onset of fast heartbeat which shortly after passed out.  He is concerned.  Time she tells me when she feels an ulcer you feel flushed cold and sweaty.  She has similar episodes during her first pregnancy but it was mostly just the nausea with no passing out.  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   Dx'd by colonoscopy/biopsy   Complication of anesthesia    hard time waking up   Family history of adverse reaction to anesthesia    mother had a hard time of waking up   Inflammatory bowel disease     Past Surgical History:  Procedure Laterality Date   COLONOSCOPY W/ BIOPSIES  07-11-07   Colonoscopy/Biopsy, dx ulcerative proctitis   LAPAROSCOPIC OVARIAN CYSTECTOMY Right 09/12/2015   Procedure: RIGHT LAPAROSCOPIC OOPHORECTOMY;  Surgeon: Christophe Louis, MD;  Location: [facility];  Service: Gynecology;  Laterality: Right;  Possible Right Oophorectomy   LAPAROSCOPY N/A 09/12/2015   Procedure: LAPAROSCOPY DIAGNOSTIC;  Surgeon: Christophe Louis, MD;  Location: [facility];  Service: Gynecology;  Laterality: N/A;      OB History     Gravida  2   Para  1   Term  1   Preterm      AB      Living  1      SAB      IAB      Ectopic      Multiple      Live Births  1               Current Medications: Current Meds  Medication Sig   acetaminophen (TYLENOL) 500 MG tablet 1-2 every 8 hours as needed for mild pain (Patient taking differently: Take 500-1,000 mg by mouth every 8 (eight) hours as needed for moderate pain or headache. 1-2 every 8 hours as needed for mild pain)   famotidine (PEPCID) 20 MG tablet Take 20 mg by mouth 2 (two) times daily.   ondansetron (ZOFRAN) 8 MG tablet Take 8 mg by mouth every 8 (eight) hours as needed for nausea or vomiting.   Prenatal Vit-Fe Fumarate-FA (MULTIVITAMIN-PRENATAL) 27-0.8 MG TABS tablet Take 1 tablet by mouth daily at 12 noon.     Allergies:  Codeine, Doxycycline, Other, and Triamcinolone   Social History   Socioeconomic History   Marital status: Single    Spouse name: Not on file   Number of children: Not on file   Years of education: Not on file   Highest education level: Not on file  Occupational History   Not on file  Tobacco Use   Smoking status: Never   Smokeless tobacco: Never  Substance and Sexual Activity   Alcohol use: No   Drug use: No   Sexual activity: Yes    Partners: Female    Birth control/protection: None  Other Topics Concern   Not on file  Social History Narrative   Starting 10th grade   Social Determinants of Health   Financial Resource Strain: Medium Risk   Difficulty of Paying Living Expenses: Somewhat hard  Food Insecurity: No Food Insecurity   Worried About Charity fundraiser in the Last Year: Never true   Ran Out of Food in the Last Year: Never  true  Transportation Needs: No Transportation Needs   Lack of Transportation (Medical): No   Lack of Transportation (Non-Medical): No  Physical Activity: Not on file  Stress: Not on file  Social Connections: Not on file      Family History  Problem Relation Age of Onset   Hypertension Paternal Grandmother    Diabetes Paternal Grandmother    Heart disease Paternal Grandfather    Inflammatory bowel disease Neg Hx       ROS:   Please see the history of present illness.     Review of Systems  Constitution: Reports lightheadedness and dizziness.  Negative for decreased appetite, fever and weight gain.  HENT: Negative for congestion, ear discharge, hoarse voice and sore throat.   Eyes: Negative for discharge, redness, vision loss in right eye and visual halos.  Cardiovascular: Negative for chest pain, dyspnea on exertion, leg swelling, orthopnea and palpitations.  Respiratory: Negative for cough, hemoptysis, shortness of breath and snoring.   Endocrine: Negative for heat intolerance and polyphagia.  Hematologic/Lymphatic: Negative for bleeding problem. Does not bruise/bleed easily.  Skin: Negative for flushing, nail changes, rash and suspicious lesions.  Musculoskeletal: Negative for arthritis, joint pain, muscle cramps, myalgias, neck pain and stiffness.  Gastrointestinal: Negative for abdominal pain, bowel incontinence, diarrhea and excessive appetite.  Genitourinary: Negative for decreased libido, genital sores and incomplete emptying.  Neurological: Negative for brief paralysis, focal weakness, headaches and loss of balance.  Psychiatric/Behavioral: Negative for altered mental status, depression and suicidal ideas.  Allergic/Immunologic: Negative for HIV exposure and persistent infections.     Labs/EKG Reviewed:    EKG:   EKG is was not ordered today.  The ekg ordered on October 19, 2018 shows sinus rhythm, heart rate 81 bpm  Recent Labs: 10/18/2020: ALT 12; BUN 8;  Creatinine, Ser 0.54; Hemoglobin 11.2; Platelets 312; Potassium 3.9; Sodium 134   Recent Lipid Panel No results found for: CHOL, TRIG, HDL, CHOLHDL, LDLCALC, LDLDIRECT  Physical Exam:    VS:  Ht '5\' 3"'$  (1.6 m)   Wt 178 lb (80.7 kg)   LMP 06/13/2020   SpO2 98%   BMI 31.53 kg/m     Wt Readings from Last 3 Encounters:  10/20/20 178 lb (80.7 kg)  09/26/20 176 lb 12.8 oz (80.2 kg)  09/23/15 150 lb 3.2 oz (68.1 kg)     GEN:  Well nourished, well developed in no acute distress HEENT: Normal NECK: No JVD; No carotid bruits LYMPHATICS: No lymphadenopathy CARDIAC:  RRR, 2/6 holosystolic murmurs, rubs, gallops RESPIRATORY:  Clear to auscultation without rales, wheezing or rhonchi  ABDOMEN: Soft, non-tender, non-distended MUSCULOSKELETAL:  No edema; No deformity  SKIN: Warm and dry NEUROLOGIC:  Alert and oriented x 3 PSYCHIATRIC:  Normal affect    Risk Assessment/Risk Calculators:     CARPREG II Risk Prediction Index Score:  1.  The patient's risk for a primary cardiac event is 5%.            ASSESSMENT & PLAN:    Orthostatic hypotension Vasovagal syncope Nausea and vomiting Palpitations  In the office today we did her orthostatic vitals which was positive sitting 121/73 heart rate 107, volume 112/68 heart rate 112, standing 98/64 heart rate 123 at which time she also was symptomatic dizzy. Today we talked about the first that is trying nonpharmacologic approach to her episodic hypotension and syncope.  I have asked the patient to get support stockings to wear this daily as well as increase of fluid intake which can also include Gatorade as well.  She is going to add some salty crackers to her diet.  We will plan that she should remember to drink some sort of fluid is every 2-3 hours.  And eat small frequent meals.  Giving the palpitations which was associated with one of her episodes we will place a ZIO monitor on the patient to make sure that there is no underlying  arrhythmias.  For completeness an echocardiogram will be performed to rule out any valvular abnormalities.   Patient Instructions  Medication Instructions:  Your physician recommends that you continue on your current medications as directed. Please refer to the Current Medication list given to you today.  *If you need a refill on your cardiac medications before your next appointment, please call your pharmacy*   Lab Work: None If you have labs (blood work) drawn today and your tests are completely normal, you will receive your results only by: [place] (if you have MyChart) OR A paper copy in the mail If you have any lab test that is abnormal or we need to change your treatment, we will call you to review the results.   Testing/Procedures: Your physician has requested that you have an echocardiogram. Echocardiography is a painless test that uses sound waves to create images of your heart. It provides your doctor with information about the size and shape of your heart and how well your heart's chambers and valves are working. This procedure takes approximately one hour. There are no restrictions for this procedure.  A zio monitor was ordered today. It will remain on for 14 days. You will then return monitor and event diary in provided box. It takes 1-2 weeks for report to be downloaded and returned to [place]. We will call you with the results. If monitor falls off or has orange flashing light, please call Zio for further instructions.   ZIO  WHY IS MY DOCTOR PRESCRIBING ZIO? The Zio system is proven and trusted by physicians to detect and diagnose irregular heart rhythms -- and has been prescribed to hundreds of thousands of patients.  The FDA has cleared the Zio system to monitor for many different kinds of irregular heart rhythms. In a study, physicians were able to reach a diagnosis 90% of the time with the Zio system1.  You can wear the Zio monitor -- a small, discreet,  comfortable patch -- during your normal day-to-day activity, including while you sleep, shower, and exercise, while it records every single  heartbeat for analysis.  1Barrett, P., et al. Comparison of 24 Hour Holter Monitoring Versus 14 Day Novel Adhesive Patch Electrocardiographic Monitoring. [place], 2014.  ZIO VS. HOLTER MONITORING The Zio monitor can be comfortably worn for up to 14 days. Holter monitors can be worn for 24 to 48 hours, limiting the time to record any irregular heart rhythms you may have. Zio is able to capture data for the 51% of patients who have their first symptom-triggered arrhythmia after 48 hours.1  LIVE WITHOUT RESTRICTIONS The Zio ambulatory cardiac monitor is a small, unobtrusive, and water-resistant patch--you might even forget you're wearing it. The Zio monitor records and stores every beat of your heart, whether you're sleeping, working out, or showering. Remove 12 days after applying.     Follow-Up: At [facility], you and your health needs are our priority.  As part of our continuing mission to provide you with exceptional heart care, we have created designated Provider Care Teams.  These Care Teams include your primary Cardiologist (physician) and Advanced Practice Providers (APPs -  Physician Assistants and Nurse Practitioners) who all work together to provide you with the care you need, when you need it.  We recommend signing up for the patient portal called "MyChart".  Sign up information is provided on this After Visit Summary.  MyChart is used to connect with patients for Virtual Visits (Telemedicine).  Patients are able to view lab/test results, encounter notes, upcoming appointments, etc.  Non-urgent messages can be sent to your provider as well.   To learn more about what you can do with MyChart, go to [email or web address].    Your next appointment:   4-6 week(s)  The format for your next appointment:   In  Person  Provider:   Berniece Salines, DO [address], [place], [place] [address]    Other Instructions 1. Please increase fluid intake, can use Gatorade as well.  2. Compression socks/stockings. Please wear compression sock/stockings daily (10 psi or greater) 3. It's okay to increase salt intake slightly.   Echocardiogram An echocardiogram is a test that uses sound waves (ultrasound) to produce images of the heart. Images from an echocardiogram can provide important information about: Heart size and shape. The size and thickness and movement of your heart's walls. Heart muscle function and strength. Heart valve function or if you have stenosis. Stenosis is when the heart valves are too narrow. If blood is flowing backward through the heart valves (regurgitation). A tumor or infectious growth around the heart valves. Areas of heart muscle that are not working well because of poor blood flow or injury from a heart attack. Aneurysm detection. An aneurysm is a weak or damaged part of an artery wall. The wall bulges out from the normal force of blood pumping through the body. Tell a health care provider about: Any allergies you have. All medicines you are taking, including vitamins, herbs, eye drops, creams, and over-the-counter medicines. Any blood disorders you have. Any surgeries you have had. Any medical conditions you have. Whether you are pregnant or may be pregnant. What are the risks? Generally, this is a safe test. However, problems may occur, including an allergic reaction to dye (contrast) that may be used during the test. What happens before the test? No specific preparation is needed. You may eat and drink normally. What happens during the test?  You will take off your clothes from the waist up and put on a hospital gown. Electrodes or electrocardiogram (ECG)patches may be  placed on your chest. The electrodes or patches are then connected to a device that monitors your  heart rate and rhythm. You will lie down on a table for an ultrasound exam. A gel will be applied to your chest to help sound waves pass through your skin. A handheld device, called a transducer, will be pressed against your chest and moved over your heart. The transducer produces sound waves that travel to your heart and bounce back (or "echo" back) to the transducer. These sound waves will be captured in real-time and changed into images of your heart that can be viewed on a video monitor. The images will be recorded on a computer and reviewed by your health care provider. You may be asked to change positions or hold your breath for a short time. This makes it easier to get different views or better views of your heart. In some cases, you may receive contrast through an IV in one of your veins. This can improve the quality of the pictures from your heart. The procedure may vary among health care providers and hospitals. What can I expect after the test? You may return to your normal, everyday life, including diet, activities, and medicines, unless your health care provider tells you not to do that. Follow these instructions at home: It is up to you to get the results of your test. Ask your health care provider, or the department that is doing the test, when your results will be ready. Keep all follow-up visits. This is important. Summary An echocardiogram is a test that uses sound waves (ultrasound) to produce images of the heart. Images from an echocardiogram can provide important information about the size and shape of your heart, heart muscle function, heart valve function, and other possible heart problems. You do not need to do anything to prepare before this test. You may eat and drink normally. After the echocardiogram is completed, you may return to your normal, everyday life, unless your health care provider tells you not to do that. This information is not intended to replace advice given  to you by your health care provider. Make sure you discuss any questions you have with your health care provider. Document Revised: 09/22/2019 Document Reviewed: 09/22/2019 Elsevier Patient Education  [place]:  No follow-ups on file.   Medication Adjustments/Labs and Tests Ordered: Current medicines are reviewed at length with the patient today.  Concerns regarding medicines are outlined above.  Tests Ordered: Orders Placed This Encounter  Procedures   LONG TERM MONITOR (3-14 DAYS)   ECHOCARDIOGRAM COMPLETE   Medication Changes: No orders of the defined types were placed in this encounter.

## 2020-10-28 ENCOUNTER — Telehealth: Payer: Self-pay | Admitting: Cardiology

## 2020-10-28 NOTE — Telephone Encounter (Signed)
Patient calling to follow up on her heart monitor. She states she has not received it in the mail yet.

## 2020-10-28 NOTE — Telephone Encounter (Signed)
Returned call to patient. According to [employer] her monitor is scheduled to be delivered by the end of the day. But also looking at the tracking its appears the monitor has been floating from truck to truck for the last week. If she does not receive the monitor tonight. I provided her Zio's number to call and request a new one be sent.

## 2020-10-31 ENCOUNTER — Other Ambulatory Visit: Payer: Self-pay

## 2020-10-31 ENCOUNTER — Ambulatory Visit (HOSPITAL_BASED_OUTPATIENT_CLINIC_OR_DEPARTMENT_OTHER)
Admission: RE | Admit: 2020-10-31 | Discharge: 2020-10-31 | Disposition: A | Discharge status: Home / Self Care | Payer: Medicaid Other | Source: Ambulatory Visit | Attending: Cardiology | Admitting: Cardiology

## 2020-10-31 DIAGNOSIS — R55 Syncope and collapse: Secondary | ICD-10-CM | POA: Insufficient documentation

## 2020-10-31 LAB — ECHOCARDIOGRAM COMPLETE
AR max vel: 2.31 cm2
AV Area VTI: 2 cm2
AV Area mean vel: 2.41 cm2
AV Mean grad: 4 mmHg
AV Peak grad: 8.3 mmHg
Ao pk vel: 1.44 m/s
Area-P 1/2: 3.76 cm2
Calc EF: 69.2 %
S' Lateral: 3 cm
Single Plane A2C EF: 68.4 %
Single Plane A4C EF: 67.5 %

## 2020-11-01 ENCOUNTER — Telehealth: Payer: Self-pay

## 2020-11-01 NOTE — Telephone Encounter (Signed)
-----   Message from Berniece Salines, DO sent at 11/01/2020  1:34 PM EDT ----- Echo normal

## 2020-11-01 NOTE — Telephone Encounter (Signed)
Patient notified of results.

## 2020-11-01 NOTE — Telephone Encounter (Signed)
Patient returning call.

## 2020-11-01 NOTE — Telephone Encounter (Signed)
Left message on patients voicemail to please return our call.   

## 2020-11-03 DIAGNOSIS — R55 Syncope and collapse: Secondary | ICD-10-CM

## 2020-11-28 ENCOUNTER — Other Ambulatory Visit: Payer: Self-pay | Admitting: Obstetrics

## 2020-11-28 DIAGNOSIS — Z363 Encounter for antenatal screening for malformations: Secondary | ICD-10-CM

## 2020-11-29 ENCOUNTER — Ambulatory Visit: Payer: Medicaid Other | Admitting: *Deleted

## 2020-11-29 ENCOUNTER — Ambulatory Visit: Payer: Medicaid Other | Attending: Obstetrics

## 2020-11-29 ENCOUNTER — Other Ambulatory Visit: Payer: Self-pay

## 2020-11-29 ENCOUNTER — Encounter: Payer: Self-pay | Admitting: *Deleted

## 2020-11-29 VITALS — BP 107/64 | HR 90

## 2020-11-29 DIAGNOSIS — Z363 Encounter for antenatal screening for malformations: Secondary | ICD-10-CM | POA: Diagnosis not present

## 2020-12-02 ENCOUNTER — Ambulatory Visit: Payer: Medicaid Other | Admitting: Cardiology

## 2020-12-13 ENCOUNTER — Encounter: Payer: Self-pay | Admitting: Cardiology

## 2020-12-22 ENCOUNTER — Ambulatory Visit: Payer: Medicaid Other | Admitting: Cardiology

## 2020-12-29 ENCOUNTER — Inpatient Hospital Stay (HOSPITAL_COMMUNITY)
Admission: AD | Admit: 2020-12-29 | Discharge: 2020-12-29 | Disposition: A | Discharge status: Home / Self Care | Payer: Medicaid Other | Attending: Obstetrics and Gynecology | Admitting: Obstetrics and Gynecology

## 2020-12-29 ENCOUNTER — Encounter (HOSPITAL_COMMUNITY): Payer: Self-pay | Admitting: Obstetrics and Gynecology

## 2020-12-29 ENCOUNTER — Other Ambulatory Visit: Payer: Self-pay

## 2020-12-29 DIAGNOSIS — O99613 Diseases of the digestive system complicating pregnancy, third trimester: Secondary | ICD-10-CM | POA: Insufficient documentation

## 2020-12-29 DIAGNOSIS — N898 Other specified noninflammatory disorders of vagina: Secondary | ICD-10-CM

## 2020-12-29 DIAGNOSIS — N939 Abnormal uterine and vaginal bleeding, unspecified: Secondary | ICD-10-CM

## 2020-12-29 DIAGNOSIS — K5190 Ulcerative colitis, unspecified, without complications: Secondary | ICD-10-CM | POA: Diagnosis not present

## 2020-12-29 DIAGNOSIS — N9489 Other specified conditions associated with female genital organs and menstrual cycle: Secondary | ICD-10-CM

## 2020-12-29 DIAGNOSIS — R109 Unspecified abdominal pain: Secondary | ICD-10-CM | POA: Diagnosis not present

## 2020-12-29 DIAGNOSIS — O26853 Spotting complicating pregnancy, third trimester: Secondary | ICD-10-CM | POA: Diagnosis not present

## 2020-12-29 DIAGNOSIS — Z3A28 28 weeks gestation of pregnancy: Secondary | ICD-10-CM

## 2020-12-29 DIAGNOSIS — O26852 Spotting complicating pregnancy, second trimester: Secondary | ICD-10-CM | POA: Diagnosis not present

## 2020-12-29 DIAGNOSIS — R102 Pelvic and perineal pain: Secondary | ICD-10-CM | POA: Diagnosis present

## 2020-12-29 DIAGNOSIS — O26893 Other specified pregnancy related conditions, third trimester: Secondary | ICD-10-CM | POA: Diagnosis not present

## 2020-12-29 DIAGNOSIS — Z3689 Encounter for other specified antenatal screening: Secondary | ICD-10-CM

## 2020-12-29 LAB — URINALYSIS, ROUTINE W REFLEX MICROSCOPIC
Bilirubin Urine: NEGATIVE
Glucose, UA: NEGATIVE mg/dL
Hgb urine dipstick: NEGATIVE
Ketones, ur: NEGATIVE mg/dL
Leukocytes,Ua: NEGATIVE
Nitrite: NEGATIVE
Protein, ur: NEGATIVE mg/dL
Specific Gravity, Urine: 1.019 (ref 1.005–1.030)
pH: 7 (ref 5.0–8.0)

## 2020-12-29 LAB — WET PREP, GENITAL
Clue Cells Wet Prep HPF POC: NONE SEEN
Sperm: NONE SEEN
Trich, Wet Prep: NONE SEEN
WBC, Wet Prep HPF POC: >=10 — AB (ref <10)
Yeast Wet Prep HPF POC: NONE SEEN

## 2020-12-29 LAB — OB RESULTS CONSOLE GC/CHLAMYDIA: Gonorrhea: NEGATIVE

## 2020-12-29 NOTE — MAU Provider Note (Addendum)
History     CSN: [id number]  Arrival date and time: 12/29/20 2013   None     Chief Complaint  Patient presents with   Abdominal Pain   Pelvic Pain   Vaginal Bleeding   HPI 25 yo G6P1041 at 30w3dwho presents with intermittent lower abdominal cramping for one week and also pink tinged discharge intermittently for last four days - reports cramping more in vagina than in abdomen - reports does not feel like contractions - denies leaking of fluid - does report increased vaginal discharge - mucousy discharge - denies bright red blood - last intercourse three weeks ago - nothing in vagina since then - Currently not in pain - denies dysuria - hx of ulcerative colitis but this feels different - no hx of preterm labor, has had 4 SABs before - per chart review, anterior placenta on [place] on 10/18  OB History     Gravida  6   Para  1   Term  1   Preterm      AB  4   Living  1      SAB  4   IAB      Ectopic      Multiple      Live Births  1           Past Medical History:  Diagnosis Date   Anemia    Anxiety    Chronic ulcerative proctitis with rectal bleeding ([place]) 07-11-07   Dx'd by colonoscopy/biopsy   Complication of anesthesia    hard time waking up   Family history of adverse reaction to anesthesia    mother had a hard time of waking up   Inflammatory bowel disease     Past Surgical History:  Procedure Laterality Date   COLONOSCOPY W/ BIOPSIES  07-11-07   Colonoscopy/Biopsy, dx ulcerative proctitis   LAPAROSCOPIC OVARIAN CYSTECTOMY Right 09/12/2015   Procedure: RIGHT LAPAROSCOPIC OOPHORECTOMY;  Surgeon: Christophe Louis, MD;  Location: [facility];  Service: Gynecology;  Laterality: Right;  Possible Right Oophorectomy   LAPAROSCOPY N/A 09/12/2015   Procedure: LAPAROSCOPY DIAGNOSTIC;  Surgeon: Christophe Louis, MD;  Location: [facility];  Service: Gynecology;  Laterality: N/A;    Family History  Problem Relation Age of Onset   Hypertension Paternal Grandmother     Diabetes Paternal Grandmother    Heart disease Paternal Grandfather    Inflammatory bowel disease Neg Hx     Social History   Tobacco Use   Smoking status: Never   Smokeless tobacco: Never  Vaping Use   Vaping Use: Never used  Substance Use Topics   Alcohol use: No   Drug use: No    Allergies:  Allergies  Allergen Reactions   Codeine Nausea Only and Nausea And Vomiting   Doxycycline Nausea And Vomiting   Other Other (See Comments)    Steroid cream- causes blisters   Triamcinolone Hives and Swelling    Medications Prior to Admission  Medication Sig Dispense Refill Last Dose   famotidine (PEPCID) 20 MG tablet Take 20 mg by mouth 2 (two) times daily.   12/29/2020   ondansetron (ZOFRAN) 8 MG tablet Take 8 mg by mouth every 8 (eight) hours as needed for nausea or vomiting.   Past Week   acetaminophen (TYLENOL) 500 MG tablet 1-2 every 8 hours as needed for mild pain (Patient taking differently: Take 500-1,000 mg by mouth every 8 (eight) hours as needed for moderate pain or headache. 1-2 every 8 hours as needed  for mild pain) 30 tablet 0    Prenatal Vit-Fe Fumarate-FA (MULTIVITAMIN-PRENATAL) 27-0.8 MG TABS tablet Take 1 tablet by mouth daily at 12 noon.       Review of Systems  Constitutional:  Negative for chills and fever.  HENT:  Negative for congestion.   Respiratory:  Negative for chest tightness.   Cardiovascular:  Negative for chest pain.  Genitourinary:  Positive for vaginal discharge. Negative for dysuria, flank pain, frequency, hematuria and vaginal pain.  Musculoskeletal:  Negative for back pain.  Physical Exam   Blood pressure 113/72, pulse (!) 101, temperature 98 F (36.7 C), resp. rate 16, height 5\' 3"  (1.6 m), weight 83.9 kg, last menstrual period 06/13/2020, SpO2 99 %.  Physical Exam Vitals and nursing note reviewed. Exam conducted with a chaperone present.  Constitutional:      Appearance: She is well-developed.     Comments: Well appearing. Appears  comfortable laying down while watching TV show on phone  HENT:     Head: Normocephalic.  Cardiovascular:     Rate and Rhythm: Normal rate.  Pulmonary:     Effort: Pulmonary effort is normal.  Abdominal:     General: Abdomen is flat. Bowel sounds are normal. There is no distension.     Palpations: Abdomen is soft.     Tenderness: There is no abdominal tenderness. There is no guarding.     Comments: No TTP   Genitourinary:    Cervix: No cervical motion tenderness or friability.     Adnexa: Right adnexa normal and left adnexa normal.     Comments: On speculum exam no blood (including not in vault or from os), some thick white discharge seen, no pooling. Cervix appears closed although not visualized fully due to vaginal tissue.  On cervical exam - cervix closed/thick/high Skin:    Capillary Refill: Capillary refill takes less than 2 seconds.  Neurological:     General: No focal deficit present.     Mental Status: She is alert.    MAU Course  Procedures NST FHT - 120s, accels present, no decels No contractions on toco  MDM moderate  Assessment and Plan  25 yo [id number] at [redacted]w[redacted]d who presents with intermittent lower abdominal cramping for one week and also pink tinged discharge intermittently for last four days. Vaginal spotting Vaginal discharge 3.  Vaginal/lower abdominal cramping Intermittent vaginal cramping and intermittent episode of pink tinged discharge and spotting. No cramping while here. Last pink tinged discharge/spotting about 24 hours ago. DDX includes vaginal infection vs. Urinary infection vs. Preterm contractions. Less likely to be preterm contractions given no contractions on monitor and cervix closed. Also unlikely to be bleeding from cervix/uterus such as abruption given os closed, no blood in vault and no history of bright red of frank blank (and only had pink tinged spotting) - wet mount  - Vaginitis and GC/CT sent - UA  - offered pain medication and patient  defers at this time  9:54 PM UA without evidence of WBC, bacteria, or other signs of infection. Will send for culture to confirm. Wet mount without yeast or clue cells. Given patient comfortable on exam without evidence of frank blood, contractions, or cervical dilation ok to discharge home. Return precautions given to patient.   Renard Matter, MD, MPH OB Fellow, Faculty Practice

## 2020-12-29 NOTE — Discharge Instructions (Signed)
You came to the MAU because you had some vaginal cramping, discharge, spotting and some lower abdominal discomfort. We did a speculum exam and saw some discharge and collected swabs. We did not see any large pools of fluid. We also did not see any blood on our exam. When we checked your cervix with our fingers it was closed. The initial swabs we tested were negative but we did send swabs to the lab to check for infections. If any of them come back positive we will give you a call to discuss treatment.  We also checked your urine and the initial test showed there was no bacteria. We sent it for further testing in the lab and will let you know if there is an infection.  Please seek medical care if you have worsening vaginal bleeding, leaking of fluids, feel like your baby is not moving as much, and contraction like pains that come every 5 mins  and are hard to breathe or talk through

## 2020-12-29 NOTE — MAU Note (Signed)
For a week I have been having a lot of pelvic pressure and occ lower abd cramping. Today had pink spotting. Good FM. Denies LOF

## 2020-12-30 LAB — GC/CHLAMYDIA PROBE AMP (~~LOC~~) NOT AT ARMC
Chlamydia: NEGATIVE
Comment: NEGATIVE
Comment: NORMAL
Neisseria Gonorrhea: NEGATIVE

## 2020-12-31 LAB — CULTURE, OB URINE: Culture: 2000 — AB

## 2021-01-01 ENCOUNTER — Other Ambulatory Visit: Payer: Self-pay | Admitting: Family Medicine

## 2021-01-01 MED ORDER — CEFADROXIL 500 MG PO CAPS: 500.0000 mg | ORAL_CAPSULE | Freq: Two times a day (BID) | ORAL | 0 refills | Status: DC

## 2021-01-04 ENCOUNTER — Telehealth: Payer: Self-pay | Admitting: Family Medicine

## 2021-01-04 ENCOUNTER — Other Ambulatory Visit: Payer: Self-pay | Admitting: Family Medicine

## 2021-01-04 MED ORDER — CEFADROXIL 500 MG PO CAPS: 500.0000 mg | ORAL_CAPSULE | Freq: Two times a day (BID) | ORAL | 0 refills | Status: AC

## 2021-01-04 MED ORDER — CEFADROXIL 500 MG PO CAPS: 500.0000 mg | ORAL_CAPSULE | Freq: Two times a day (BID) | ORAL | 0 refills | Status: DC

## 2021-01-04 NOTE — Telephone Encounter (Signed)
Attempted to call patient to discuss urine culture results - GBS <2000 colonies isolated.   No answer. Had also called two days ago without answer  Will send MyChart message  Renard Matter, MD, MPH OB Fellow, Faculty Practice

## 2021-01-27 ENCOUNTER — Other Ambulatory Visit: Payer: Self-pay

## 2021-01-27 ENCOUNTER — Non-Acute Institutional Stay (HOSPITAL_COMMUNITY)
Admission: RE | Admit: 2021-01-27 | Discharge: 2021-01-27 | Disposition: A | Discharge status: Home / Self Care | Payer: Medicaid Other | Source: Ambulatory Visit | Attending: Internal Medicine | Admitting: Internal Medicine

## 2021-01-27 DIAGNOSIS — Z3A00 Weeks of gestation of pregnancy not specified: Secondary | ICD-10-CM | POA: Diagnosis not present

## 2021-01-27 DIAGNOSIS — D509 Iron deficiency anemia, unspecified: Secondary | ICD-10-CM | POA: Insufficient documentation

## 2021-01-27 DIAGNOSIS — O99019 Anemia complicating pregnancy, unspecified trimester: Secondary | ICD-10-CM | POA: Insufficient documentation

## 2021-01-27 MED ORDER — SODIUM CHLORIDE 0.9 % IV SOLN: INTRAVENOUS | Status: DC | PRN

## 2021-01-27 MED ORDER — SODIUM CHLORIDE 0.9 % IV SOLN
510.0000 mg | Freq: Once | INTRAVENOUS | Status: AC
  Administered 2021-01-27: 510 mg via INTRAVENOUS
  Filled 2021-01-27: qty 17

## 2021-01-27 NOTE — Progress Notes (Signed)
PATIENT CARE CENTER NOTE   Diagnosis: Anemia complicating pregnancy, third trimester O99.013   Provider: Vanessa Kick, MD   Procedure: Feraheme infusion    Note:  Patient received Feraheme 510 mg infusion (dose 1 of 2) via PIV. Tolerated well with no adverse reaction. Observed patient for 30 minutes post infusion. Vital signs stable. Discharge instructions given. Patient to come back next week for second infusion. Alert, oriented and ambulatory at discharge.

## 2021-02-03 ENCOUNTER — Encounter (HOSPITAL_COMMUNITY): Payer: Medicaid Other

## 2021-02-12 ENCOUNTER — Inpatient Hospital Stay (HOSPITAL_COMMUNITY)
Admission: AD | Admit: 2021-02-12 | Discharge: 2021-02-12 | Disposition: A | Discharge status: Home / Self Care | Payer: Medicaid Other | Source: Ambulatory Visit | Attending: Obstetrics | Admitting: Obstetrics

## 2021-02-12 ENCOUNTER — Encounter (HOSPITAL_COMMUNITY): Payer: Self-pay | Admitting: Obstetrics

## 2021-02-12 ENCOUNTER — Other Ambulatory Visit: Payer: Self-pay

## 2021-02-12 DIAGNOSIS — O212 Late vomiting of pregnancy: Secondary | ICD-10-CM | POA: Diagnosis present

## 2021-02-12 DIAGNOSIS — O210 Mild hyperemesis gravidarum: Secondary | ICD-10-CM | POA: Insufficient documentation

## 2021-02-12 DIAGNOSIS — Z3A34 34 weeks gestation of pregnancy: Secondary | ICD-10-CM | POA: Diagnosis not present

## 2021-02-12 DIAGNOSIS — O219 Vomiting of pregnancy, unspecified: Secondary | ICD-10-CM

## 2021-02-12 HISTORY — DX: Ulcerative colitis, unspecified, without complications: K51.90

## 2021-02-12 LAB — URINALYSIS, ROUTINE W REFLEX MICROSCOPIC
Bilirubin Urine: NEGATIVE
Glucose, UA: NEGATIVE mg/dL
Hgb urine dipstick: NEGATIVE
Ketones, ur: NEGATIVE mg/dL
Nitrite: NEGATIVE
Protein, ur: 30 mg/dL — AB
Specific Gravity, Urine: 1.028 (ref 1.005–1.030)
pH: 5 (ref 5.0–8.0)

## 2021-02-12 LAB — OB RESULTS CONSOLE GBS: GBS: POSITIVE

## 2021-02-12 MED ORDER — PROMETHAZINE HCL 25 MG/ML IJ SOLN: 25.0000 mg | Freq: Once | INTRAMUSCULAR | Status: DC

## 2021-02-12 MED ORDER — LACTATED RINGERS IV SOLN: Freq: Once | INTRAVENOUS | Status: DC

## 2021-02-12 MED ORDER — LACTATED RINGERS IV BOLUS
1000.0000 mL | Freq: Once | INTRAVENOUS | Status: AC
  Administered 2021-02-12: 1000 mL via INTRAVENOUS

## 2021-02-12 MED ORDER — ONDANSETRON HCL 4 MG/2ML IJ SOLN
4.0000 mg | Freq: Once | INTRAMUSCULAR | Status: AC
  Administered 2021-02-12: 4 mg via INTRAVENOUS
  Filled 2021-02-12: qty 2

## 2021-02-12 MED ORDER — FAMOTIDINE IN NACL 20-0.9 MG/50ML-% IV SOLN
20.0000 mg | Freq: Once | INTRAVENOUS | Status: AC
  Administered 2021-02-12: 20 mg via INTRAVENOUS
  Filled 2021-02-12: qty 50

## 2021-02-12 NOTE — MAU Note (Signed)
Presents with c/o nausea/vomiting x1 week, vomiting intermittently able to keep some things down.  Denies VB or LOF.  Endorses +FM.

## 2021-02-12 NOTE — L&D Delivery Note (Signed)
Patient was C/C/+1 and pushed for 10 minutes with epidural.    NSVD  female infant, Apgars 9/9, weight pending.   The patient had no lacerations requiring repair. Fundus was firm. EBL was expected amount. Placenta was delivered intact. Vagina was clear.  Delayed cord clamping done for 30-60 seconds while warming baby. Baby was vigorous and doing skin to skin with mother.  Allyn Kenner

## 2021-02-12 NOTE — MAU Provider Note (Signed)
History     CSN: [id number]  Arrival date and time: 02/12/21 1626   Event Date/Time   First Provider Initiated Contact with Patient 02/12/21 1743      Chief Complaint  Patient presents with   Emesis   Nausea   [name] [name] is a 26 y.o. year old [id number] female at [redacted]w[redacted]d weeks gestation who presents to MAU reporting N/V x 1 week; has been an on-going issue throughout this pregnancy. She states the N/V over the past week has been "different," but she is able to keep some things down. The N/V is intermittent. The last time she vomited today was this morning.  She denies VB or LOF. She reports good (+) FM. She receives her [facility] with [facility]; her next appt is 02/16/2021.   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   Dx'd by colonoscopy/biopsy   Complication of anesthesia    hard time waking up   Family history of adverse reaction to anesthesia    mother had a hard time of waking up   Inflammatory bowel disease    Ulcerative colitis [facility])     Past Surgical History:  Procedure Laterality Date   COLONOSCOPY W/ BIOPSIES  07-11-07   Colonoscopy/Biopsy, dx ulcerative proctitis   LAPAROSCOPIC OVARIAN CYSTECTOMY Right 09/12/2015   Procedure: RIGHT LAPAROSCOPIC OOPHORECTOMY;  Surgeon: Christophe Louis, MD;  Location: [facility];  Service: Gynecology;  Laterality: Right;  Possible Right Oophorectomy   LAPAROSCOPY N/A 09/12/2015   Procedure: LAPAROSCOPY DIAGNOSTIC;  Surgeon: Christophe Louis, MD;  Location: [facility];  Service: Gynecology;  Laterality: N/A;    Family History  Problem Relation Age of Onset   Hypertension Paternal Grandmother    Diabetes Paternal Grandmother    Heart disease Paternal Grandfather     Inflammatory bowel disease Neg Hx     Social History   Tobacco Use   Smoking status: Never   Smokeless tobacco: Never  Vaping Use   Vaping Use: Never used  Substance Use Topics   Alcohol use: No   Drug use: No    Allergies:  Allergies  Allergen Reactions   Codeine Nausea Only and Nausea And Vomiting   Doxycycline Nausea And Vomiting   Other Other (See Comments)    Steroid cream- causes blisters   Triamcinolone Hives and Swelling    Medications Prior to Admission  Medication Sig Dispense Refill Last Dose   acetaminophen (TYLENOL) 500 MG tablet 1-2 every 8 hours as needed for mild pain (Patient taking differently: Take 500-1,000 mg by mouth every 8 (eight) hours as needed for moderate pain or headache. 1-2 every 8 hours as needed for mild pain) 30 tablet 0 02/12/2021   famotidine (PEPCID) 20 MG tablet Take 20 mg by mouth 2 (two) times daily.   02/11/2021   ondansetron (ZOFRAN) 8 MG tablet Take 8 mg by mouth every 8 (eight) hours as needed for nausea or vomiting.   02/12/2021  Prenatal Vit-Fe Fumarate-FA (MULTIVITAMIN-PRENATAL) 27-0.8 MG TABS tablet Take 1 tablet by mouth daily at 12 noon.   02/12/2021    Review of Systems  Constitutional: Negative.   HENT: Negative.    Eyes: Negative.   Respiratory: Negative.    Cardiovascular: Negative.   Gastrointestinal:  Positive for nausea and vomiting.  Endocrine: Negative.   Genitourinary: Negative.   Musculoskeletal: Negative.   Skin: Negative.   Allergic/Immunologic: Negative.   Neurological: Negative.   Hematological: Negative.   Psychiatric/Behavioral: Negative.    Physical Exam   Blood pressure 116/85, pulse (!) 126, temperature (!) 97.4 F (36.3 C), temperature source Oral, resp. rate 19, height 5\' 3"  (1.6 m), weight 88.9 kg, last menstrual period 06/13/2020, SpO2 100 %.  Physical Exam Vitals and nursing note reviewed.  Constitutional:      Appearance: Normal appearance.  Cardiovascular:     Rate and Rhythm: Tachycardia  present.  Pulmonary:     Effort: Pulmonary effort is normal.  Abdominal:     Palpations: Abdomen is soft.  Genitourinary:    Comments: deferred Musculoskeletal:        General: Normal range of motion.  Skin:    General: Skin is warm and dry.  Neurological:     Mental Status: She is alert and oriented to person, place, and time.  Psychiatric:        Mood and Affect: Mood normal.        Behavior: Behavior normal.        Thought Content: Thought content normal.        Judgment: Judgment normal.             REACTIVE NST - FHR: 145 bpm / moderate variability / accels present / decels absent / TOCO: OCC UC MAU Course  Procedures  MDM CCUA IVFs: LR 1000 ml @ 999 ml/hr Zofran 4 mg IVPB -- resolved nausea/vomiting Pepcid 20 mg IVPB PO Challenge -- patient tolerated well  Results for orders placed or performed during the hospital encounter of 02/12/21 (from the past 24 hour(s))  Urinalysis, Routine w reflex microscopic Urine, Clean Catch     Status: Abnormal   Collection Time: 02/12/21  5:04 PM  Result Value Ref Range   Color, Urine AMBER (A) YELLOW   APPearance HAZY (A) CLEAR   Specific Gravity, Urine 1.028 1.005 - 1.030   pH 5.0 5.0 - 8.0   Glucose, UA NEGATIVE NEGATIVE mg/dL   Hgb urine dipstick NEGATIVE NEGATIVE   Bilirubin Urine NEGATIVE NEGATIVE   Ketones, ur NEGATIVE NEGATIVE mg/dL   Protein, ur 30 (A) NEGATIVE mg/dL   Nitrite NEGATIVE NEGATIVE   Leukocytes,Ua MODERATE (A) NEGATIVE   RBC / HPF 0-5 0 - 5 RBC/hpf   WBC, UA 6-10 0 - 5 WBC/hpf   Bacteria, UA RARE (A) NONE SEEN   Squamous Epithelial / LPF 11-20 0 - 5   Mucus PRESENT     Assessment and Plan  Nausea and vomiting during pregnancy - Information provided on hyperemesis gravidarum   [redacted] weeks gestation of pregnancy   - Discharge patient - Keep scheduled appt on 02/16/2021 - Patient verbalized an understanding of the plan of care and agrees.    Laury Deep, CNM 02/12/2021, 5:43 PM

## 2021-02-14 LAB — CULTURE, OB URINE

## 2021-02-23 ENCOUNTER — Other Ambulatory Visit: Payer: Self-pay

## 2021-02-23 ENCOUNTER — Encounter (HOSPITAL_COMMUNITY): Payer: Self-pay | Admitting: Obstetrics and Gynecology

## 2021-02-23 ENCOUNTER — Ambulatory Visit: Payer: Self-pay | Admitting: *Deleted

## 2021-02-23 ENCOUNTER — Inpatient Hospital Stay (HOSPITAL_COMMUNITY)
Admission: AD | Admit: 2021-02-23 | Discharge: 2021-02-23 | Disposition: A | Discharge status: Home / Self Care | Payer: Medicaid Other | Attending: Obstetrics and Gynecology | Admitting: Obstetrics and Gynecology

## 2021-02-23 DIAGNOSIS — N760 Acute vaginitis: Secondary | ICD-10-CM | POA: Diagnosis not present

## 2021-02-23 DIAGNOSIS — O26893 Other specified pregnancy related conditions, third trimester: Secondary | ICD-10-CM | POA: Insufficient documentation

## 2021-02-23 DIAGNOSIS — R102 Pelvic and perineal pain: Secondary | ICD-10-CM | POA: Insufficient documentation

## 2021-02-23 DIAGNOSIS — B9689 Other specified bacterial agents as the cause of diseases classified elsewhere: Secondary | ICD-10-CM | POA: Insufficient documentation

## 2021-02-23 DIAGNOSIS — O23593 Infection of other part of genital tract in pregnancy, third trimester: Secondary | ICD-10-CM | POA: Diagnosis not present

## 2021-02-23 DIAGNOSIS — R109 Unspecified abdominal pain: Secondary | ICD-10-CM | POA: Insufficient documentation

## 2021-02-23 DIAGNOSIS — Z3A36 36 weeks gestation of pregnancy: Secondary | ICD-10-CM | POA: Insufficient documentation

## 2021-02-23 LAB — WET PREP, GENITAL
Sperm: NONE SEEN
Trich, Wet Prep: NONE SEEN
WBC, Wet Prep HPF POC: >=10 — AB (ref <10)
Yeast Wet Prep HPF POC: NONE SEEN

## 2021-02-23 MED ORDER — METRONIDAZOLE 500 MG PO TABS
500.0000 mg | ORAL_TABLET | Freq: Two times a day (BID) | ORAL | 0 refills | Status: DC
Start: 2021-02-23 — End: 2021-03-19

## 2021-02-23 NOTE — MAU Provider Note (Signed)
Patient [name] [name] is a 26 y.o. [id number];  At [redacted]w[redacted]d here with complaints of one gush of vaginal discharge this evening when she was vomiting and feeling some contractions.  She has on-going nausea and vomiting during this pregnancy. She denies vaginal bleeding, decreased fetal movements. She had one episode spotting yesterday and on  Sunday but none today. No recent intercourse.  She denies fever, chest pain, SOB, constipation, diarrhea. She denies any complications in this pregnancy ;  she denies CHTN, GTHN, DM, GDM. Otherwise uncomplicated prenatal course.   She called the nurse line for [facility] and reported contractions and was told to come in. While she was in the parking lot she vomited and then she felt a gush; she was not sure if it was urine or LOF. She denies ongoing LOF, has not had to change her underwear all day or wear pantyliner.   History     CSN: [id number]  Arrival date and time: 02/23/21 2052   Event Date/Time   First Provider Initiated Contact with Patient 02/23/21 2136      Chief Complaint  Patient presents with   Abdominal Pain   Rupture of Membranes   Abdominal Pain This is a new problem. The current episode started today. The onset quality is sudden. The problem has been unchanged. The pain is at a severity of 5/10. The quality of the pain is cramping. The abdominal pain radiates to the epigastric region and suprapubic region. Associated symptoms include headaches. Exacerbated by: walking. The pain is relieved by Nothing. She has tried nothing for the symptoms.   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   Dx'd by colonoscopy/biopsy   Complication of anesthesia    hard time waking up   Family  history of adverse reaction to anesthesia    mother had a hard time of waking up   Inflammatory bowel disease    Ulcerative colitis [facility])     Past Surgical History:  Procedure Laterality Date   COLONOSCOPY W/ BIOPSIES  07-11-07   Colonoscopy/Biopsy, dx ulcerative proctitis   LAPAROSCOPIC OVARIAN CYSTECTOMY Right 09/12/2015   Procedure: RIGHT LAPAROSCOPIC OOPHORECTOMY;  Surgeon: Christophe Louis, MD;  Location: [facility];  Service: Gynecology;  Laterality: Right;  Possible Right Oophorectomy   LAPAROSCOPY N/A 09/12/2015   Procedure: LAPAROSCOPY DIAGNOSTIC;  Surgeon: Christophe Louis, MD;  Location: [facility];  Service: Gynecology;  Laterality: N/A;    Family History  Problem Relation Age of Onset   Hypertension Paternal Grandmother    Diabetes Paternal Grandmother    Heart disease Paternal Grandfather    Inflammatory bowel disease Neg Hx     Social History   Tobacco Use   Smoking status: Never   Smokeless tobacco: Never  Vaping Use   Vaping Use: Never used  Substance Use Topics   Alcohol use: No   Drug use: No    Allergies:  Allergies  Allergen Reactions   Codeine Nausea Only and Nausea  And Vomiting   Doxycycline Nausea And Vomiting   Other Other (See Comments)    Steroid cream- causes blisters   Triamcinolone Hives and Swelling    Medications Prior to Admission  Medication Sig Dispense Refill Last Dose   famotidine (PEPCID) 20 MG tablet Take 20 mg by mouth 2 (two) times daily.   02/23/2021   ondansetron (ZOFRAN) 8 MG tablet Take 8 mg by mouth every 8 (eight) hours as needed for nausea or vomiting.   02/23/2021   Prenatal Vit-Fe Fumarate-FA (MULTIVITAMIN-PRENATAL) 27-0.8 MG TABS tablet Take 1 tablet by mouth daily at 12 noon.   02/23/2021   acetaminophen (TYLENOL) 500 MG tablet 1-2 every 8 hours as needed for mild pain (Patient taking differently: Take 500-1,000 mg by mouth every 8 (eight) hours as needed for moderate pain or headache. 1-2 every 8 hours as needed for mild pain) 30 tablet 0      Review of Systems  Constitutional: Negative.   HENT: Negative.    Respiratory: Negative.    Cardiovascular: Negative.   Gastrointestinal:  Positive for abdominal pain.  Genitourinary: Negative.   Neurological:  Positive for headaches.  Hematological: Negative.   Psychiatric/Behavioral: Negative.    Physical Exam   Blood pressure 116/76, pulse 91, temperature 98.5 F (36.9 C), temperature source Oral, resp. rate 20, height 5\' 3"  (1.6 m), weight 91.1 kg, last menstrual period 06/13/2020.  Physical Exam Abdominal:     General: Abdomen is flat.     Tenderness: There is no abdominal tenderness.  Genitourinary:    Vagina: Normal. No bleeding.     Cervix: Normal.     Comments: Thick white mucous, no pooling, cervix is about 0.5., thick, ballotable Skin:    General: Skin is warm.  Neurological:     General: No focal deficit present.     Mental Status: She is alert.  Psychiatric:        Mood and Affect: Mood normal.    MAU Course  Procedures  MDM -NST: 135 bpm, mod var, present acel, no decels, no contractions -sterile fern is negative -wet prep shows BV -UA normal  Assessment and Plan   1. Bacterial vaginosis   2. [redacted] weeks gestation of pregnancy   -patient stable for discharge with plan to keep prenatal appointment next week -RX for Flagyl with instructions given -reviewed warning signs and when to return to MAU, patient verbalized understanding and feels reassured that she is not SROM   Mervyn Skeeters Manmeet Arzola 02/23/2021, 9:46 PM

## 2021-02-23 NOTE — MAU Note (Signed)
PT SAYS WAS IN CAR - THROWING UP FROM UC[facility] -  FLUID CAME OUT - SHE THINKS WAS URINE BUT NOT SURE  NO FLUID NOW  LAST SEX- YESTERDAY  PNC WITH DR CALLAHAN- VE  1 CM DENIES HSV GBS- COLLECTED YESTERDAY

## 2021-02-23 NOTE — Telephone Encounter (Signed)
°  Chief Complaint: Contractions Q 31minutes Symptoms: "Abdomen tensing every 5 minutes. Baby moving less Frequency: All day 1o-15 minutes, now every 5. Pertinent Negatives: Patient denies  Disposition: [x] ED /[] Urgent Care (no appt availability in office) / [] Appointment(In office/virtual)/ []  [facility]/ [] Home Care/ [] Refused Recommended Disposition /[] [place] [facility]/ []  Follow-up with PCP Additional Notes Reason for Disposition  Baby moving less today (e.g., kick count < 5 in 1 hour or < 10 in 2 hours)  Answer Assessment - Initial Assessment Questions 1. ONSET: "When did the symptoms begin?"        All day 2. CONTRACTIONS: "Describe the contractions that you are having." (e.g., duration, frequency, regularity, severity)     Tensing sensation 3. EDD: "What date are you expecting to deliver?"     03/20/21 4. PARITY: "Have you had a baby before?" If Yes, ask: "How long did the labor last?"     No 5. FETAL MOVEMENT: "Has the baby's movement decreased or changed significantly from normal?"     Changed, less movement 6. OTHER SYMPTOMS: "Do you have any other symptoms?" (e.g., leaking fluid from vagina, vaginal bleeding, fever, hand/facial swelling)     Lower back pain 10-15 minutes apart, now 15 minutes  Protocols used: Pregnancy - Labor-A-AH

## 2021-03-08 ENCOUNTER — Telehealth (HOSPITAL_COMMUNITY): Payer: Self-pay | Admitting: *Deleted

## 2021-03-08 NOTE — Telephone Encounter (Signed)
Preadmission screen  

## 2021-03-09 ENCOUNTER — Telehealth (HOSPITAL_COMMUNITY): Payer: Self-pay | Admitting: *Deleted

## 2021-03-09 NOTE — Telephone Encounter (Signed)
Preadmission screen  

## 2021-03-10 ENCOUNTER — Telehealth (HOSPITAL_COMMUNITY): Payer: Self-pay | Admitting: *Deleted

## 2021-03-10 NOTE — Telephone Encounter (Signed)
Preadmission screen  

## 2021-03-13 ENCOUNTER — Encounter (HOSPITAL_COMMUNITY): Payer: Self-pay | Admitting: *Deleted

## 2021-03-13 ENCOUNTER — Telehealth (HOSPITAL_COMMUNITY): Payer: Self-pay | Admitting: *Deleted

## 2021-03-13 NOTE — Telephone Encounter (Signed)
Preadmission screen  

## 2021-03-16 ENCOUNTER — Encounter (HOSPITAL_COMMUNITY): Payer: Self-pay | Admitting: Obstetrics and Gynecology

## 2021-03-19 ENCOUNTER — Other Ambulatory Visit: Payer: Self-pay

## 2021-03-19 ENCOUNTER — Inpatient Hospital Stay (EMERGENCY_DEPARTMENT_HOSPITAL)
Admission: AD | Admit: 2021-03-19 | Discharge: 2021-03-19 | Disposition: A | Discharge status: Home / Self Care | Payer: Medicaid Other | Source: Home / Self Care | Attending: Obstetrics and Gynecology | Admitting: Obstetrics and Gynecology

## 2021-03-19 DIAGNOSIS — Z3A39 39 weeks gestation of pregnancy: Secondary | ICD-10-CM

## 2021-03-19 DIAGNOSIS — O471 False labor at or after 37 completed weeks of gestation: Secondary | ICD-10-CM | POA: Insufficient documentation

## 2021-03-19 DIAGNOSIS — Z0371 Encounter for suspected problem with amniotic cavity and membrane ruled out: Secondary | ICD-10-CM | POA: Diagnosis not present

## 2021-03-19 LAB — POCT FERN TEST: POCT Fern Test: NEGATIVE

## 2021-03-19 LAB — AMNISURE RUPTURE OF MEMBRANE (ROM) NOT AT ARMC: Amnisure ROM: NEGATIVE

## 2021-03-19 NOTE — Discharge Instructions (Signed)

## 2021-03-19 NOTE — MAU Provider Note (Signed)
S: Ms. [name] [name] is a 26 y.o. [phone] at [redacted]w[redacted]d  who presents to MAU today complaining of leaking of fluid since she was in the car on the way here. She denies vaginal bleeding. She endorses contractions. She reports normal fetal movement.    O: BP 115/79    Pulse 93    Temp 98.2 F (36.8 C) (Oral)    Resp 16    Ht 5\' 3"  (1.6 m)    Wt 91.3 kg    LMP 06/13/2020 (Exact Date)    SpO2 99% Comment: room air   BMI 35.66 kg/m  GENERAL: Well-developed, well-nourished female in no acute distress.  HEAD: Normocephalic, atraumatic.  CHEST: Normal effort of breathing, regular heart rate ABDOMEN: Soft, nontender, gravid  Cervical exam:  Dilation: 3 Effacement (%): 50 Cervical Position: Posterior Station: Ballotable Presentation: Undeterminable Exam by:: Fredda Hammed RN   Fetal Monitoring: Baseline: 125 Variability: moderate Accelerations: 15x15 Decelerations: none Contractions: occasional uc's  Patient rechecked after 1 hour with no cervical change. Labor and delivery with high census right now.  Results for orders placed or performed during the hospital encounter of 03/19/21 (from the past 24 hour(s))  Amnisure rupture of membrane (rom)not at [facility]     Status: None   Collection Time: 03/19/21 12:43 PM  Result Value Ref Range   Amnisure ROM NEGATIVE   Fern Test     Status: None   Collection Time: 03/19/21 12:50 PM  Result Value Ref Range   POCT Fern Test Negative = intact amniotic membranes      A: SIUP at [redacted]w[redacted]d  Membranes intact  P: -Discharge home in stable condition -Labor precautions discussed -Patient advised to follow-up with [facility] tomorrow for IOL as scheduled  -Patient may return to MAU as needed or if her condition were to change or worsen   Wende Mott, [place] 03/19/2021 4:56 PM

## 2021-03-19 NOTE — MAU Note (Signed)
[name] [name] is a 26 y.o. at [redacted]w[redacted]d here in MAU reporting: scheduled for IOL, states has been contracting all night. Contractions are every 4-5 min and feels like they are getting stronger. On the way here started having some LOF. Has not checked for color. Had some spotting yesterday but none so far this morning. +FM   Onset of complaint: last night  Pain score: 8/10  Vitals:   03/19/21 1159  BP: 129/87  Pulse: (!) 103  Resp: 16  Temp: 98.2 F (36.8 C)  SpO2: 99%     FHT:150  Lab orders placed from triage: none

## 2021-03-20 ENCOUNTER — Inpatient Hospital Stay (HOSPITAL_COMMUNITY): Payer: Medicaid Other | Admitting: Anesthesiology

## 2021-03-20 ENCOUNTER — Inpatient Hospital Stay (HOSPITAL_COMMUNITY)
Admission: AD | Admit: 2021-03-20 | Discharge: 2021-03-22 | DRG: 807 | Disposition: A | Discharge status: Home / Self Care | Payer: Medicaid Other | Attending: Obstetrics and Gynecology | Admitting: Obstetrics and Gynecology

## 2021-03-20 ENCOUNTER — Inpatient Hospital Stay (HOSPITAL_COMMUNITY): Payer: Medicaid Other

## 2021-03-20 ENCOUNTER — Other Ambulatory Visit: Payer: Self-pay

## 2021-03-20 ENCOUNTER — Encounter (HOSPITAL_COMMUNITY): Payer: Self-pay | Admitting: Obstetrics and Gynecology

## 2021-03-20 DIAGNOSIS — O99824 Streptococcus B carrier state complicating childbirth: Principal | ICD-10-CM | POA: Diagnosis present

## 2021-03-20 DIAGNOSIS — Z20822 Contact with and (suspected) exposure to covid-19: Secondary | ICD-10-CM | POA: Diagnosis present

## 2021-03-20 DIAGNOSIS — K219 Gastro-esophageal reflux disease without esophagitis: Secondary | ICD-10-CM | POA: Diagnosis present

## 2021-03-20 DIAGNOSIS — O26893 Other specified pregnancy related conditions, third trimester: Secondary | ICD-10-CM | POA: Diagnosis present

## 2021-03-20 DIAGNOSIS — O9962 Diseases of the digestive system complicating childbirth: Secondary | ICD-10-CM | POA: Diagnosis present

## 2021-03-20 DIAGNOSIS — Z3A40 40 weeks gestation of pregnancy: Secondary | ICD-10-CM

## 2021-03-20 DIAGNOSIS — Z3490 Encounter for supervision of normal pregnancy, unspecified, unspecified trimester: Principal | ICD-10-CM

## 2021-03-20 HISTORY — DX: Gastro-esophageal reflux disease without esophagitis: K21.9

## 2021-03-20 LAB — TYPE AND SCREEN
ABO/RH(D): A POS
Antibody Screen: NEGATIVE

## 2021-03-20 LAB — CBC
HCT: 35.9 % — ABNORMAL LOW (ref 36.0–46.0)
Hemoglobin: 11.6 g/dL — ABNORMAL LOW (ref 12.0–15.0)
MCH: 27.6 pg (ref 26.0–34.0)
MCHC: 32.3 g/dL (ref 30.0–36.0)
MCV: 85.3 fL (ref 80.0–100.0)
Platelets: 296 10*3/uL (ref 150–400)
RBC: 4.21 MIL/uL (ref 3.87–5.11)
RDW: 17.7 % — ABNORMAL HIGH (ref 11.5–15.5)
WBC: 10.2 10*3/uL (ref 4.0–10.5)
nRBC: 0 % (ref 0.0–0.2)

## 2021-03-20 LAB — RESP PANEL BY RT-PCR (FLU A&B, COVID) ARPGX2
Influenza A by PCR: NEGATIVE
Influenza B by PCR: NEGATIVE
SARS Coronavirus 2 by RT PCR: NEGATIVE

## 2021-03-20 LAB — RPR: RPR Ser Ql: NONREACTIVE

## 2021-03-20 MED ORDER — LACTATED RINGERS IV SOLN
500.0000 mL | Freq: Once | INTRAVENOUS | Status: AC
  Administered 2021-03-20: 500 mL via INTRAVENOUS

## 2021-03-20 MED ORDER — ACETAMINOPHEN 325 MG PO TABS
650.0000 mg | ORAL_TABLET | ORAL | Status: DC | PRN
  Administered 2021-03-20 – 2021-03-21 (×3): 650 mg via ORAL
  Filled 2021-03-20 (×3): qty 2

## 2021-03-20 MED ORDER — MISOPROSTOL 25 MCG QUARTER TABLET
25.0000 ug | ORAL_TABLET | ORAL | Status: DC | PRN
  Administered 2021-03-20 (×2): 25 ug via VAGINAL
  Filled 2021-03-20 (×3): qty 1

## 2021-03-20 MED ORDER — OXYCODONE HCL 5 MG PO TABS
5.0000 mg | ORAL_TABLET | ORAL | Status: DC | PRN
  Administered 2021-03-21 – 2021-03-22 (×2): 5 mg via ORAL
  Filled 2021-03-20 (×3): qty 1

## 2021-03-20 MED ORDER — TERBUTALINE SULFATE 1 MG/ML IJ SOLN: 0.2500 mg | Freq: Once | INTRAMUSCULAR | Status: DC | PRN

## 2021-03-20 MED ORDER — ZOLPIDEM TARTRATE 5 MG PO TABS: 5.0000 mg | ORAL_TABLET | Freq: Every evening | ORAL | Status: DC | PRN

## 2021-03-20 MED ORDER — OXYTOCIN-SODIUM CHLORIDE 30-0.9 UT/500ML-% IV SOLN
1.0000 m[IU]/min | INTRAVENOUS | Status: DC
  Administered 2021-03-20: 2 m[IU]/min via INTRAVENOUS
  Filled 2021-03-20: qty 500

## 2021-03-20 MED ORDER — LIDOCAINE HCL (PF) 1 % IJ SOLN
INTRAMUSCULAR | Status: DC | PRN
  Administered 2021-03-20 (×2): 4 mL via EPIDURAL

## 2021-03-20 MED ORDER — TETANUS-DIPHTH-ACELL PERTUSSIS 5-2.5-18.5 LF-MCG/0.5 IM SUSY: 0.5000 mL | PREFILLED_SYRINGE | Freq: Once | INTRAMUSCULAR | Status: DC

## 2021-03-20 MED ORDER — LIDOCAINE HCL (PF) 1 % IJ SOLN: 30.0000 mL | INTRAMUSCULAR | Status: DC | PRN

## 2021-03-20 MED ORDER — PENICILLIN G POT IN DEXTROSE 60000 UNIT/ML IV SOLN
3.0000 10*6.[IU] | INTRAVENOUS | Status: DC
  Administered 2021-03-20 (×3): 3 10*6.[IU] via INTRAVENOUS
  Filled 2021-03-20 (×3): qty 50

## 2021-03-20 MED ORDER — PHENYLEPHRINE 40 MCG/ML (10ML) SYRINGE FOR IV PUSH (FOR BLOOD PRESSURE SUPPORT): 80.0000 ug | PREFILLED_SYRINGE | INTRAVENOUS | Status: DC | PRN

## 2021-03-20 MED ORDER — LACTATED RINGERS IV SOLN: INTRAVENOUS | Status: DC

## 2021-03-20 MED ORDER — SODIUM CHLORIDE 0.9 % IV SOLN
5.0000 10*6.[IU] | Freq: Once | INTRAVENOUS | Status: AC
  Administered 2021-03-20: 5 10*6.[IU] via INTRAVENOUS
  Filled 2021-03-20: qty 5

## 2021-03-20 MED ORDER — ONDANSETRON HCL 4 MG PO TABS: 4.0000 mg | ORAL_TABLET | ORAL | Status: DC | PRN

## 2021-03-20 MED ORDER — IBUPROFEN 600 MG PO TABS
600.0000 mg | ORAL_TABLET | Freq: Four times a day (QID) | ORAL | Status: DC
  Administered 2021-03-20: 600 mg via ORAL
  Filled 2021-03-20 (×3): qty 1

## 2021-03-20 MED ORDER — FENTANYL-BUPIVACAINE-NACL 0.5-0.125-0.9 MG/250ML-% EP SOLN
12.0000 mL/h | EPIDURAL | Status: DC | PRN
  Administered 2021-03-20: 12 mL/h via EPIDURAL
  Filled 2021-03-20: qty 250

## 2021-03-20 MED ORDER — SIMETHICONE 80 MG PO CHEW: 80.0000 mg | CHEWABLE_TABLET | ORAL | Status: DC | PRN

## 2021-03-20 MED ORDER — PHENYLEPHRINE 40 MCG/ML (10ML) SYRINGE FOR IV PUSH (FOR BLOOD PRESSURE SUPPORT)
80.0000 ug | PREFILLED_SYRINGE | INTRAVENOUS | Status: DC | PRN
  Filled 2021-03-20: qty 10

## 2021-03-20 MED ORDER — OXYTOCIN-SODIUM CHLORIDE 30-0.9 UT/500ML-% IV SOLN: 2.5000 [IU]/h | INTRAVENOUS | Status: DC

## 2021-03-20 MED ORDER — EPHEDRINE 5 MG/ML INJ: 10.0000 mg | INTRAVENOUS | Status: DC | PRN

## 2021-03-20 MED ORDER — LACTATED RINGERS IV SOLN: 500.0000 mL | INTRAVENOUS | Status: DC | PRN

## 2021-03-20 MED ORDER — BENZOCAINE-MENTHOL 20-0.5 % EX AERO: 1.0000 "application " | INHALATION_SPRAY | CUTANEOUS | Status: DC | PRN

## 2021-03-20 MED ORDER — ACETAMINOPHEN 325 MG PO TABS: 650.0000 mg | ORAL_TABLET | ORAL | Status: DC | PRN

## 2021-03-20 MED ORDER — PRENATAL MULTIVITAMIN CH
1.0000 | ORAL_TABLET | Freq: Every day | ORAL | Status: DC
  Administered 2021-03-21: 1 via ORAL
  Filled 2021-03-20: qty 1

## 2021-03-20 MED ORDER — ONDANSETRON HCL 4 MG/2ML IJ SOLN: 4.0000 mg | INTRAMUSCULAR | Status: DC | PRN

## 2021-03-20 MED ORDER — COCONUT OIL OIL
1.0000 "application " | TOPICAL_OIL | Status: DC | PRN
  Administered 2021-03-21: 1 via TOPICAL

## 2021-03-20 MED ORDER — DIPHENHYDRAMINE HCL 25 MG PO CAPS: 25.0000 mg | ORAL_CAPSULE | Freq: Four times a day (QID) | ORAL | Status: DC | PRN

## 2021-03-20 MED ORDER — OXYCODONE HCL 5 MG PO TABS
10.0000 mg | ORAL_TABLET | ORAL | Status: DC | PRN
  Administered 2021-03-21 (×2): 10 mg via ORAL
  Filled 2021-03-20 (×2): qty 2

## 2021-03-20 MED ORDER — ONDANSETRON HCL 4 MG/2ML IJ SOLN
4.0000 mg | Freq: Four times a day (QID) | INTRAMUSCULAR | Status: DC | PRN
  Administered 2021-03-20 (×2): 4 mg via INTRAVENOUS
  Filled 2021-03-20 (×2): qty 2

## 2021-03-20 MED ORDER — FENTANYL CITRATE (PF) 100 MCG/2ML IJ SOLN
50.0000 ug | INTRAMUSCULAR | Status: DC | PRN
  Administered 2021-03-20: 50 ug via INTRAVENOUS
  Filled 2021-03-20: qty 2

## 2021-03-20 MED ORDER — DIPHENHYDRAMINE HCL 50 MG/ML IJ SOLN: 12.5000 mg | INTRAMUSCULAR | Status: DC | PRN

## 2021-03-20 MED ORDER — SOD CITRATE-CITRIC ACID 500-334 MG/5ML PO SOLN: 30.0000 mL | ORAL | Status: DC | PRN

## 2021-03-20 MED ORDER — OXYTOCIN BOLUS FROM INFUSION
333.0000 mL | Freq: Once | INTRAVENOUS | Status: AC
  Administered 2021-03-20: 333 mL via INTRAVENOUS

## 2021-03-20 MED ORDER — DIBUCAINE (PERIANAL) 1 % EX OINT: 1.0000 "application " | TOPICAL_OINTMENT | CUTANEOUS | Status: DC | PRN

## 2021-03-20 MED ORDER — WITCH HAZEL-GLYCERIN EX PADS: 1.0000 "application " | MEDICATED_PAD | CUTANEOUS | Status: DC | PRN

## 2021-03-20 MED ORDER — SENNOSIDES-DOCUSATE SODIUM 8.6-50 MG PO TABS
2.0000 | ORAL_TABLET | Freq: Every day | ORAL | Status: DC
  Administered 2021-03-21 – 2021-03-22 (×2): 2 via ORAL
  Filled 2021-03-20 (×2): qty 2

## 2021-03-20 NOTE — Anesthesia Preprocedure Evaluation (Signed)
Anesthesia Evaluation  Patient identified by MRN, date of birth, ID band Patient awake    Reviewed: Allergy & Precautions, Patient's Chart, lab work & pertinent test results  History of Anesthesia Complications Negative for: history of anesthetic complications  Airway Mallampati: II  TM Distance: >3 FB Neck ROM: Full    Dental no notable dental hx.    Pulmonary neg pulmonary ROS,    Pulmonary exam normal        Cardiovascular negative cardio ROS Normal cardiovascular exam     Neuro/Psych Anxiety Depression negative neurological ROS     GI/Hepatic Neg liver ROS, GERD  ,UC   Endo/Other  negative endocrine ROS  Renal/GU negative Renal ROS  negative genitourinary   Musculoskeletal negative musculoskeletal ROS (+)   Abdominal   Peds  Hematology negative hematology ROS (+)   Anesthesia Other Findings Day of surgery medications reviewed with patient.  Reproductive/Obstetrics (+) Pregnancy                             Anesthesia Physical Anesthesia Plan  ASA: 2  Anesthesia Plan: Epidural   Post-op Pain Management:    Induction:   PONV Risk Score and Plan: Treatment may vary due to age or medical condition  Airway Management Planned: Natural Airway  Additional Equipment: Fetal Monitoring  Intra-op Plan:   Post-operative Plan:   Informed Consent: I have reviewed the patients History and Physical, chart, labs and discussed the procedure including the risks, benefits and alternatives for the proposed anesthesia with the patient or authorized representative who has indicated his/her understanding and acceptance.       Plan Discussed with:   Anesthesia Plan Comments:         Anesthesia Quick Evaluation

## 2021-03-20 NOTE — H&P (Signed)
26 y.o. [redacted]w[redacted]d  [id number] presents for schedule IOL for term pregnancy.  Otherwise has good fetal movement and no bleeding.  Past Medical History:  Diagnosis Date   Anemia    Anxiety    Chronic ulcerative proctitis with rectal bleeding ([place]) 07/11/2007   Dx'd by colonoscopy/biopsy   Complication of anesthesia    hard time waking up had high level with last epidural.  see note in chart   Family history of adverse reaction to anesthesia    mother had a hard time of waking up   GERD (gastroesophageal reflux disease)    Inflammatory bowel disease    Ulcerative colitis [facility])     Past Surgical History:  Procedure Laterality Date   COLONOSCOPY W/ BIOPSIES  07-11-07   Colonoscopy/Biopsy, dx ulcerative proctitis   LAPAROSCOPIC OVARIAN CYSTECTOMY Right 09/12/2015   Procedure: RIGHT LAPAROSCOPIC OOPHORECTOMY;  Surgeon: Christophe Louis, MD;  Location: [facility];  Service: Gynecology;  Laterality: Right;  Possible Right Oophorectomy   LAPAROSCOPY N/A 09/12/2015   Procedure: LAPAROSCOPY DIAGNOSTIC;  Surgeon: Christophe Louis, MD;  Location: [facility];  Service: Gynecology;  Laterality: N/A;    OB History  Gravida Para Term Preterm AB Living  6 1 1   4 1   SAB IAB Ectopic Multiple Live Births  4       1    # Outcome Date GA Lbr Len/2nd Weight Sex Delivery Anes PTL Lv  6 Current           5 Term 05/10/12 [redacted]w[redacted]d 09:01 / 00:28 3345 g M Vag-Vacuum EPI  LIV  4 SAB           3 SAB           2 SAB           1 SAB             Social History   Socioeconomic History   Marital status: Single    Spouse name: Not on file   Number of children: Not on file   Years of education: Not on file   Highest education level: Not on file  Occupational History   Not on file  Tobacco Use   Smoking status: Never   Smokeless tobacco: Never  Vaping Use   Vaping Use: Never used  Substance and Sexual Activity   Alcohol use: No   Drug use: No   Sexual activity: Yes    Partners: Female    Birth control/protection: None  Other  Topics Concern   Not on file  Social History Narrative   Starting 10th grade   Social Determinants of Health   Financial Resource Strain: Medium Risk   Difficulty of Paying Living Expenses: Somewhat hard  Food Insecurity: No Food Insecurity   Worried About Charity fundraiser in the Last Year: Never true   Ran Out of Food in the Last Year: Never true  Transportation Needs: No Transportation Needs   Lack of Transportation (Medical): No   Lack of Transportation (Non-Medical): No  Physical Activity: Not on file  Stress: Not on file  Social Connections: Not on file  Intimate Partner Violence: Not on file   Codeine, Doxycycline, Other, and Triamcinolone    Prenatal Transfer Tool  Maternal Diabetes: No Genetic Screening: Declined Maternal Ultrasounds/Referrals: Normal Fetal Ultrasounds or other Referrals:  Referred to Materal Fetal Medicine  for suboptimal anatomy views- normal Maternal Substance Abuse:  No Significant Maternal Medications:  None Significant Maternal Lab Results: Group B Strep  positive  Other PNC: uncomplicated.    Vitals:   03/20/21 0030 03/20/21 0411 03/20/21 0633  BP: 117/76 105/64 111/69  Pulse: 87 66 73  Resp: 18 16 18   Temp: 98.4 F (36.9 C) 97.8 F (36.6 C) 97.8 F (36.6 C)  TempSrc:  Oral Oral  Weight: 91.3 kg    Height: 5\' 3"  (1.6 m)      Lungs/Cor:  NAD Abdomen:  soft, gravid Ex:  no cords, erythema SVE:  4/70/-2 FHTs:  125, good STV, NST R; Cat 1 tracing. Toco:  q 3-6   A/P   Admitted for IOL S/p cytotec x 2, no favorable Will proceed with pitocin 2x2 and anticipate AROM  GBS Pos- PCN Epidural when desired  [name] [name]

## 2021-03-20 NOTE — Anesthesia Procedure Notes (Signed)
Epidural Patient location during procedure: OB Start time: 03/20/2021 1:31 PM End time: 03/20/2021 1:34 PM  Staffing Anesthesiologist: Brennan Bailey, MD Performed: anesthesiologist   Preanesthetic Checklist Completed: patient identified, IV checked, risks and benefits discussed, monitors and equipment checked, pre-op evaluation and timeout performed  Epidural Patient position: sitting Prep: DuraPrep and site prepped and draped Patient monitoring: continuous pulse ox, blood pressure and heart rate Approach: midline Location: L3-L4 Injection technique: LOR air  Needle:  Needle type: Tuohy  Needle gauge: 17 G Needle length: 9 cm Needle insertion depth: 7 cm Catheter type: closed end flexible Catheter size: 19 Gauge Catheter at skin depth: 12 cm Test dose: negative and Other (1% lidocaine)  Assessment Events: blood not aspirated, injection not painful, no injection resistance, no paresthesia and negative IV test  Additional Notes Patient identified. Risks, benefits, and alternatives discussed with patient including but not limited to bleeding, infection, nerve damage, paralysis, failed block, incomplete pain control, headache, blood pressure changes, nausea, vomiting, reactions to medication, itching, and postpartum back pain. Confirmed with bedside nurse the patient's most recent platelet count. Confirmed with patient that they are not currently taking any anticoagulation, have any bleeding history, or any family history of bleeding disorders. Patient expressed understanding and wished to proceed. All questions were answered. Sterile technique was used throughout the entire procedure. Please see nursing notes for vital signs.   Crisp LOR on first pass. Test dose was given through epidural catheter and negative prior to continuing to dose epidural or start infusion. Warning signs of high block given to the patient including shortness of breath, tingling/numbness in hands, complete  motor block, or any concerning symptoms with instructions to call for help. Patient was given instructions on fall risk and not to get out of bed. All questions and concerns addressed with instructions to call with any issues or inadequate analgesia.  Reason for block:procedure for pain

## 2021-03-20 NOTE — Progress Notes (Signed)
Pt comfortable FHT: 125 mod var +accels no decels TOCO: q2-4 SVE: 4/70/-2 A/P: AROM'd clear fluid Pitocin 2x2 OK for IV fentanyl, pt does not want epidural.

## 2021-03-21 LAB — CBC
HCT: 33.5 % — ABNORMAL LOW (ref 36.0–46.0)
Hemoglobin: 11 g/dL — ABNORMAL LOW (ref 12.0–15.0)
MCH: 27.8 pg (ref 26.0–34.0)
MCHC: 32.8 g/dL (ref 30.0–36.0)
MCV: 84.8 fL (ref 80.0–100.0)
Platelets: 244 10*3/uL (ref 150–400)
RBC: 3.95 MIL/uL (ref 3.87–5.11)
RDW: 17.5 % — ABNORMAL HIGH (ref 11.5–15.5)
WBC: 12.4 10*3/uL — ABNORMAL HIGH (ref 4.0–10.5)
nRBC: 0 % (ref 0.0–0.2)

## 2021-03-21 MED ORDER — ACETAMINOPHEN 325 MG PO TABS
650.0000 mg | ORAL_TABLET | Freq: Four times a day (QID) | ORAL | Status: DC
  Administered 2021-03-21 – 2021-03-22 (×2): 650 mg via ORAL
  Filled 2021-03-21 (×2): qty 2

## 2021-03-21 NOTE — Lactation Note (Signed)
This note was copied from a baby's chart. Lactation Consultation Note  Patient Name: [name] [name] AJOIN'O Date: 03/21/2021 Reason for consult: Follow-up assessment;Term Age:27 hours, P2, -2% weight loss. Per mom, she feels breastfeeding is going well and infant is breastfeeding on both breast for 30 to 40 minutes, currently cluster feeding. LC did not observe latch due infant recently breastfeeding. Mom would like coconut oil due sore to nipples but no abrasions or bruises on breast.  LC informed RN of mom request for coconut oil. Mom doesn't have any questions or concerns for LC at this time. Mom knows she can call RN/LC if she has any questions, concerns or need assistance with latching infant at the breast. Mom made aware of O/P services, breastfeeding support groups, community resources, and our phone # for post-discharge questions.    Maternal Data Has patient been taught Hand Expression?: Yes Does the patient have breastfeeding experience prior to this delivery?: Yes How long did the patient breastfeed?: Per mom, she BF her 5 yeart old son for 3 months  Feeding Mother's Current Feeding Choice: Breast Milk  LATCH Score                    Lactation Tools Discussed/Used    Interventions Interventions: Breast feeding basics reviewed;Skin to skin;Hand express;Position options;Coconut oil;LC Services brochure  Discharge    Consult Status Consult Status: Follow-up Date: 03/22/21 Follow-up type: In-patient    Vicente Serene 03/21/2021, 5:34 PM

## 2021-03-21 NOTE — Anesthesia Postprocedure Evaluation (Signed)
Anesthesia Post Note  Patient: ADALYNA GODBEE  Procedure(s) Performed: AN AD HOC LABOR EPIDURAL     Patient location during evaluation: Mother Baby Anesthesia Type: Epidural Level of consciousness: awake, oriented and awake and alert Pain management: pain level not controlled (abdominal cramping pain not controlled. RN notified patient would like narcotic pain medicine in addition to the tylenol.) Vital Signs Assessment: post-procedure vital signs reviewed and stable Respiratory status: spontaneous breathing, respiratory function stable and nonlabored ventilation Cardiovascular status: stable Postop Assessment: no headache, adequate PO intake, able to ambulate, patient able to bend at knees, no backache and no apparent nausea or vomiting Anesthetic complications: no   No notable events documented.  Last Vitals:  Vitals:   03/21/21 1022 03/21/21 1427  BP: 111/71 126/85  Pulse: 94 87  Resp: 20 19  Temp: 36.9 C 36.9 C  SpO2: 98% 99%    Last Pain:  Vitals:   03/21/21 1705  TempSrc:   PainSc: 6    Pain Goal: Patients Stated Pain Goal: 0 (03/21/21 0030)                 Juliett Eastburn

## 2021-03-21 NOTE — Social Work (Signed)
MOB was referred for history of PTSD/anxiety/depression.  * Referral screened out by Clinical Social Worker because none of the following criteria appear to apply: ~ History of anxiety/depression during this pregnancy, or of post-partum depression following prior delivery. ~ Diagnosis of anxiety and/or depression within last 3 years. Per Chart review, MOB was diagnosed with depression, anxiety and PTSD in 2017.  OR * MOB's symptoms currently being treated with medication and/or therapy. Please contact the Clinical Social Worker if needs arise, by [facility] request, or if MOB scores greater than 9/yes to question 10 on Edinburgh Postpartum Depression Screen.   Kathrin Greathouse, MSW, LCSW [facility] [place] Work[facility]  [phone] 03/21/2021  11:51 AM

## 2021-03-21 NOTE — Lactation Note (Signed)
This note was copied from a baby's chart. Lactation Consultation Note Mom has declined Lactation.  Patient Name: [name] [name] YYPEJ'Y Date: 03/21/2021   Age:27 hours  Maternal Data    Feeding    LATCH Score                    Lactation Tools Discussed/Used    Interventions    Discharge    Consult Status Consult Status: Complete Date: 03/21/21    Theodoro Kalata 03/21/2021, 3:44 AM

## 2021-03-21 NOTE — Progress Notes (Signed)
POSTPARTUM PROGRESS NOTE  Post Partum Day #1  Subjective:  No acute events overnight.  Pt denies problems with ambulating, voiding or po intake.  She denies nausea or vomiting.  Pain is well controlled. Lochia Minimal.   Objective: Blood pressure 116/70, pulse 72, temperature 98.4 F (36.9 C), temperature source Oral, resp. rate 16, height 5\' 3"  (1.6 m), weight 91.3 kg, last menstrual period 06/13/2020, SpO2 100 %, unknown if currently breastfeeding.  Physical Exam:  General: alert, cooperative and no distress Lochia:normal flow Chest: CTAB Heart: RRR no m/r/g Abdomen: +BS, soft, nontender Uterine Fundus: firm, 2cm below umbilicus Extremities: neg edema, neg calf TTP BL, neg Homans BL  Recent Labs    03/20/21 0034 03/21/21 0542  HGB 11.6* 11.0*  HCT 35.9* 33.5*    Assessment/Plan:  ASSESSMENT: [name] [name] is a 26 y.o. [id number] s/p SVD @ [redacted]w[redacted]d. PNC c/b GBS.   Plan for discharge tomorrow   LOS: 1 day

## 2021-03-22 MED ORDER — OXYCODONE-ACETAMINOPHEN 5-325 MG PO TABS: 1.0000 | ORAL_TABLET | Freq: Four times a day (QID) | ORAL | 0 refills | Status: AC | PRN

## 2021-03-22 NOTE — Progress Notes (Signed)
Post Partum Day 2 Subjective: no complaints, up ad lib, voiding, tolerating PO, + flatus, and lochia mild. Pain controlled with oxycodone; cannot take NSAIDs due to ulcerative colitis.  She denies CP, SOB, HA. Feels ready for discharge to home today   Objective: Blood pressure 112/82, pulse 70, temperature 98 F (36.7 C), temperature source Oral, resp. rate 17, height 5\' 3"  (1.6 m), weight 91.3 kg, last menstrual period 06/13/2020, SpO2 94 %, unknown if currently breastfeeding.  Physical Exam:  General: alert, cooperative, and no distress Lochia: appropriate Uterine Fundus: firm Incision: n/a DVT Evaluation: No evidence of DVT seen on physical exam.  Recent Labs    03/20/21 0034 03/21/21 0542  HGB 11.6* 11.0*  HCT 35.9* 33.5*    Assessment/Plan: Discharge home and Breastfeeding Rx sent for percocet  6 week pp visit advised   LOS: 2 days   Kazuki Ingle W Shenia Alan 03/22/2021, 10:36 AM

## 2021-03-22 NOTE — Discharge Instructions (Signed)
°  Call office with any concerns [phone]

## 2021-03-22 NOTE — Discharge Summary (Signed)
Postpartum Discharge Summary  Date of Service updated      Patient Name: [name] [name] DOB: October 07, 1995 MRN: [id number]  Date of admission: 03/20/2021 Delivery date:03/20/2021  Delivering provider: Allyn Kenner  Date of discharge: 03/22/2021  Admitting diagnosis: Pregnant and not yet delivered [Z34.90] Intrauterine pregnancy: [redacted]w[redacted]d     Secondary diagnosis:  Principal Problem:   Pregnant and not yet delivered  Additional problems: none    Discharge diagnosis: Term Pregnancy Delivered                                              Post partum procedures: n/a Augmentation: AROM, Pitocin, and Cytotec Complications: None  Hospital course: Induction of Labor With Vaginal Delivery   26 y.o. yo [name] at [redacted]w[redacted]d was admitted to the hospital 03/20/2021 for induction of labor.  Indication for induction: Favorable cervix at term.  Patient had an uncomplicated labor course as follows: Membrane Rupture Time/Date: 9:53 AM ,03/20/2021   Delivery Method:Vaginal, Spontaneous  Episiotomy: None  Lacerations:  None  Details of delivery can be found in separate delivery note.  Patient had a routine postpartum course. Patient is discharged home 03/22/21.  Newborn Data: Birth date:03/20/2021  Birth time:5:41 PM  Gender:Female  Living status:Living  Apgars:9 ,9  Weight:3230 g   Magnesium Sulfate received: No BMZ received: No   Physical exam  Vitals:   03/21/21 1022 03/21/21 1427 03/21/21 2038 03/22/21 0458  BP: 111/71 126/85 121/84 112/82  Pulse: 94 87 86 70  Resp: 20 19 18 17   Temp: 98.5 F (36.9 C) 98.5 F (36.9 C) 97.7 F (36.5 C) 98 F (36.7 C)  TempSrc: Oral Oral Oral Oral  SpO2: 98% 99% 100% 94%  Weight:      Height:       General: alert, cooperative, and no distress Lochia: appropriate Uterine Fundus: firm Incision: N/A DVT Evaluation: No evidence of DVT seen on physical exam. Labs: Lab Results  Component Value Date   WBC 12.4 (H) 03/21/2021   HGB 11.0 (L)  03/21/2021   HCT 33.5 (L) 03/21/2021   MCV 84.8 03/21/2021   PLT 244 03/21/2021   CMP Latest Ref Rng & Units 10/18/2020  Glucose 70 - 99 mg/dL 78  BUN 6 - 20 mg/dL 8  Creatinine 0.44 - 1.00 mg/dL 0.54  Sodium 135 - 145 mmol/L 134(L)  Potassium 3.5 - 5.1 mmol/L 3.9  Chloride 98 - 111 mmol/L 104  CO2 22 - 32 mmol/L 25  Calcium 8.9 - 10.3 mg/dL 9.4  Total Protein 6.5 - 8.1 g/dL 6.5  Total Bilirubin 0.3 - 1.2 mg/dL 0.3  Alkaline Phos 38 - 126 U/L 54  AST 15 - 41 U/L 14(L)  ALT 0 - 44 U/L 12   Edinburgh Score: Edinburgh Postnatal Depression Scale Screening Tool 03/21/2021  I have been able to laugh and see the funny side of things. 0  I have looked forward with enjoyment to things. 0  I have blamed myself unnecessarily when things went wrong. 2  I have been anxious or worried for no good reason. 2  I have felt scared or panicky for no good reason. 0  Things have been getting on top of me. 0  I have been so unhappy that I have had difficulty sleeping. 0  I have felt sad or miserable. 0  I have been so unhappy  that I have been crying. 0  The thought of harming myself has occurred to me. 0  Edinburgh Postnatal Depression Scale Total 4      After visit meds:  Allergies as of 03/22/2021       Reactions   Codeine Nausea Only, Nausea And Vomiting   Doxycycline Nausea And Vomiting   Other Other (See Comments)   Steroid cream- causes blisters   Triamcinolone Hives, Swelling        Medication List     STOP taking these medications    acetaminophen 500 MG tablet Commonly known as: TYLENOL       TAKE these medications    famotidine 20 MG tablet Commonly known as: PEPCID Take 20 mg by mouth 2 (two) times daily.   multivitamin-prenatal 27-0.8 MG Tabs tablet Take 1 tablet by mouth daily at 12 noon.   ondansetron 8 MG tablet Commonly known as: ZOFRAN Take 8 mg by mouth every 8 (eight) hours as needed for nausea or vomiting.   oxyCODONE-acetaminophen 5-325 MG  tablet Commonly known as: Percocet Take 1 tablet by mouth every 6 (six) hours as needed for up to 7 days for severe pain.         Discharge home in stable condition Infant Feeding: Breast Infant Disposition:home with mother Discharge instruction: per After Visit Summary and Postpartum booklet. Activity: Advance as tolerated. Pelvic rest for 6 weeks.  Diet: routine diet Anticipated Birth Control: Unsure Postpartum Appointment:6 weeks Additional Postpartum F/U: Postpartum Depression checkup Future Appointments:No future appointments. Follow up Visit:  Follow-up Information     Ob/Gyn, [facility]. Schedule an appointment as soon as possible for a visit in 6 week(s).   Why: For postpartum visit Contact information: [address] [place] [address] [phone]                     03/22/2021 [name] Serge, DO

## 2021-03-22 NOTE — Lactation Note (Signed)
This note was copied from a baby's chart. Lactation Consultation Note  Patient Name: [name] [name] TUYWS'B Date: 03/22/2021 Reason for consult: Follow-up assessment Age:27 hours  P1, Baby sleeping STS on mother's chest. Feed on demand with cues.  Goal 8-12+ times per day after first 24 hrs.  Place baby STS if not cueing.  Reviewed engorgement care and monitoring voids/stools.   Lactation Tools Discussed/Used Tools: Coconut oil  Interventions Interventions: Education;Coconut oil  Discharge Discharge Education: Engorgement and breast care;Warning signs for feeding baby  Consult Status Consult Status: Complete Date: 03/22/21    Vivianne Master [facility] 03/22/2021, 12:26 PM

## 2021-03-30 ENCOUNTER — Telehealth (HOSPITAL_COMMUNITY): Payer: Self-pay | Admitting: *Deleted

## 2021-03-30 NOTE — Telephone Encounter (Signed)
Phone voicemail message left to return nurse call.  Odis Hollingshead, RN 03-30-2021 at 11:07am

## 2021-10-03 ENCOUNTER — Other Ambulatory Visit: Payer: Self-pay | Admitting: Family Medicine

## 2022-08-04 ENCOUNTER — Encounter (HOSPITAL_BASED_OUTPATIENT_CLINIC_OR_DEPARTMENT_OTHER): Payer: Self-pay

## 2022-08-04 ENCOUNTER — Emergency Department (HOSPITAL_BASED_OUTPATIENT_CLINIC_OR_DEPARTMENT_OTHER): Payer: Self-pay | Admitting: Radiology

## 2022-08-04 ENCOUNTER — Emergency Department (HOSPITAL_BASED_OUTPATIENT_CLINIC_OR_DEPARTMENT_OTHER)
Admission: EM | Admit: 2022-08-04 | Discharge: 2022-08-04 | Disposition: A | Discharge status: Home / Self Care | Payer: Self-pay | Attending: Emergency Medicine | Admitting: Emergency Medicine

## 2022-08-04 ENCOUNTER — Other Ambulatory Visit: Payer: Self-pay

## 2022-08-04 DIAGNOSIS — M62838 Other muscle spasm: Secondary | ICD-10-CM | POA: Insufficient documentation

## 2022-08-04 DIAGNOSIS — S199XXA Unspecified injury of neck, initial encounter: Secondary | ICD-10-CM | POA: Insufficient documentation

## 2022-08-04 DIAGNOSIS — S060X0A Concussion without loss of consciousness, initial encounter: Secondary | ICD-10-CM | POA: Insufficient documentation

## 2022-08-04 DIAGNOSIS — M25511 Pain in right shoulder: Secondary | ICD-10-CM | POA: Insufficient documentation

## 2022-08-04 DIAGNOSIS — G629 Polyneuropathy, unspecified: Secondary | ICD-10-CM | POA: Insufficient documentation

## 2022-08-04 DIAGNOSIS — W208XXA Other cause of strike by thrown, projected or falling object, initial encounter: Secondary | ICD-10-CM | POA: Insufficient documentation

## 2022-08-04 MED ORDER — METHOCARBAMOL 500 MG PO TABS
500.0000 mg | ORAL_TABLET | Freq: Once | ORAL | Status: AC
  Administered 2022-08-04: 500 mg via ORAL
  Filled 2022-08-04: qty 1

## 2022-08-04 MED ORDER — IBUPROFEN 800 MG PO TABS
800.0000 mg | ORAL_TABLET | Freq: Once | ORAL | Status: AC
  Administered 2022-08-04: 800 mg via ORAL
  Filled 2022-08-04: qty 1

## 2022-08-04 MED ORDER — IBUPROFEN 800 MG PO TABS: 800.0000 mg | ORAL_TABLET | Freq: Three times a day (TID) | ORAL | 0 refills | Status: AC | PRN

## 2022-08-04 MED ORDER — METHOCARBAMOL 500 MG PO TABS: 500.0000 mg | ORAL_TABLET | Freq: Two times a day (BID) | ORAL | 0 refills | Status: AC

## 2022-08-04 MED ORDER — LIDOCAINE 5 % EX PTCH: 1.0000 | MEDICATED_PATCH | CUTANEOUS | 0 refills | Status: AC

## 2022-08-04 NOTE — ED Notes (Signed)
Patient to xray.

## 2022-08-04 NOTE — ED Triage Notes (Signed)
Pt states she got hurt at work at [employer] last night. Pt states a box of [employer]eps[employer] syrup box (approx 40 lbs) fell on her. Pt states she is having right shoulder and neck pain.

## 2022-08-04 NOTE — Discharge Instructions (Addendum)
Medications for muscle relaxers, pain patches, and Motrin 800 sent to your pharmacy.  Follow-up with your primary care physician in 1 to 2 weeks if symptoms not begin to improve.

## 2022-08-04 NOTE — ED Provider Notes (Incomplete)
[name] ([name]) Provider Note   CSN: [id number] Arrival date & time: 08/04/22  1835     History {Add pertinent medical, surgical, social history, OB history to HPI:1} Chief Complaint  Patient presents with   Shoulder Pain   Neck Pain    BLIMIE [name] [name] is a 27 y.o. female.  Pt is a 27 yo female presenting for shoulder pain. Pt admits to right sided neck/shoulder pain that started after injury. Pt states yesterday at 8PM a heavy boxy, approx 40 lbs, fell from a high shelf onto her head. She denies LOC. Denies blood thinner use. Had one time episode of emesis yesterday approx 30 min after the injury. Otherwise feeling well today. Admits to pain starting in right trapezius muscles radiating down right shoulder and into the right posterior arm. Denies any open wounds or bleeding.   The history is provided by the patient. No language interpreter was used.  Shoulder Pain Associated symptoms: neck pain   Associated symptoms: no back pain and no fever   Neck Pain Associated symptoms: no chest pain and no fever        Home Medications Prior to Admission medications   Medication Sig Start Date End Date Taking? Authorizing Provider  famotidine (PEPCID) 20 MG tablet Take 20 mg by mouth 2 (two) times daily.    [provider]  ondansetron (ZOFRAN) 8 MG tablet Take 8 mg by mouth every 8 (eight) hours as needed for nausea or vomiting.    [provider]  Prenatal Vit-Fe Fumarate-FA (MULTIVITAMIN-PRENATAL) 27-0.8 MG TABS tablet Take 1 tablet by mouth daily at 12 noon.    [provider]  promethazine (PHENERGAN) 25 MG tablet TAKE 1 TABLET BY MOUTH EVERY 6 HOURS AS NEEDED FOR NAUSEA OR VOMITING 10/10/21   Levie Heritage, DO      Allergies    Codeine, Doxycycline, Other, and Triamcinolone    Review of Systems   Review of Systems  Constitutional:  Negative for chills and fever.  HENT:  Negative for ear pain and sore  throat.   Eyes:  Negative for pain and visual disturbance.  Respiratory:  Negative for cough and shortness of breath.   Cardiovascular:  Negative for chest pain and palpitations.  Gastrointestinal:  Negative for abdominal pain and vomiting.  Genitourinary:  Negative for dysuria and hematuria.  Musculoskeletal:  Positive for neck pain. Negative for arthralgias and back pain.  Skin:  Negative for color change and rash.  Neurological:  Negative for seizures and syncope.  All other systems reviewed and are negative.   Physical Exam Updated Vital Signs BP (!) 135/97   Pulse 65   Temp 98.3 F (36.8 C)   Resp 14   Ht 5' 3.5" (1.613 m)   Wt 84.8 kg   LMP 07/31/2022   SpO2 98%   BMI 32.61 kg/m  Physical Exam Vitals and nursing note reviewed.  Constitutional:      Appearance: Normal appearance.  Cardiovascular:     Rate and Rhythm: Normal rate and regular rhythm.  Neurological:     Mental Status: She is alert and oriented to person, place, and time.     GCS: GCS eye subscore is 4. GCS verbal subscore is 5. GCS motor subscore is 6.     Cranial Nerves: Cranial nerves 2-12 are intact.     Sensory: Sensation is intact.     Motor: Motor function is intact.     ED Results / Procedures /  Treatments   Labs (all labs ordered are listed, but only abnormal results are displayed) Labs Reviewed - No data to display  EKG None  Radiology No results found.  Procedures Procedures  {Document cardiac monitor, telemetry assessment procedure when appropriate:1}  Medications Ordered in ED Medications  methocarbamol (ROBAXIN) tablet 500 mg (has no administration in time range)  ibuprofen (ADVIL) tablet 800 mg (has no administration in time range)    ED Course/ Medical Decision Making/ A&P   {   Click here for ABCD2, HEART and other calculatorsREFRESH Note before signing :1}                          Medical Decision Making Amount and/or Complexity of Data Reviewed Radiology:  ordered.  Risk Prescription drug management.   ***  {Document critical care time when appropriate:1} {Document review of labs and clinical decision tools ie heart score, Chads2Vasc2 etc:1}  {Document your independent review of radiology images, and any outside records:1} {Document your discussion with family members, caretakers, and with consultants:1} {Document social determinants of health affecting pt's care:1} {Document your decision making why or why not admission, treatments were needed:1} Final Clinical Impression(s) / ED Diagnoses Final diagnoses:  None    Rx / DC Orders ED Discharge Orders     None

## 2022-12-17 ENCOUNTER — Emergency Department (HOSPITAL_BASED_OUTPATIENT_CLINIC_OR_DEPARTMENT_OTHER)
Admission: EM | Admit: 2022-12-17 | Discharge: 2022-12-17 | Disposition: A | Discharge status: Home / Self Care | Payer: Self-pay | Attending: Emergency Medicine | Admitting: Emergency Medicine

## 2022-12-17 ENCOUNTER — Other Ambulatory Visit: Payer: Self-pay

## 2022-12-17 ENCOUNTER — Emergency Department (HOSPITAL_BASED_OUTPATIENT_CLINIC_OR_DEPARTMENT_OTHER): Payer: Self-pay | Admitting: Radiology

## 2022-12-17 ENCOUNTER — Encounter (HOSPITAL_BASED_OUTPATIENT_CLINIC_OR_DEPARTMENT_OTHER): Payer: Self-pay | Admitting: Emergency Medicine

## 2022-12-17 DIAGNOSIS — R110 Nausea: Secondary | ICD-10-CM | POA: Insufficient documentation

## 2022-12-17 DIAGNOSIS — J4521 Mild intermittent asthma with (acute) exacerbation: Secondary | ICD-10-CM | POA: Insufficient documentation

## 2022-12-17 DIAGNOSIS — R197 Diarrhea, unspecified: Secondary | ICD-10-CM | POA: Insufficient documentation

## 2022-12-17 HISTORY — DX: Unspecified asthma, uncomplicated: J45.909

## 2022-12-17 LAB — CBC WITH DIFFERENTIAL/PLATELET
Abs Immature Granulocytes: 0.01 10*3/uL (ref 0.00–0.07)
Basophils Absolute: 0.1 10*3/uL (ref 0.0–0.1)
Basophils Relative: 1 %
Eosinophils Absolute: 0.5 10*3/uL (ref 0.0–0.5)
Eosinophils Relative: 7 %
HCT: 39.7 % (ref 36.0–46.0)
Hemoglobin: 13 g/dL (ref 12.0–15.0)
Immature Granulocytes: 0 %
Lymphocytes Relative: 28 %
Lymphs Abs: 1.9 10*3/uL (ref 0.7–4.0)
MCH: 27.5 pg (ref 26.0–34.0)
MCHC: 32.7 g/dL (ref 30.0–36.0)
MCV: 84.1 fL (ref 80.0–100.0)
Monocytes Absolute: 0.8 10*3/uL (ref 0.1–1.0)
Monocytes Relative: 12 %
Neutro Abs: 3.7 10*3/uL (ref 1.7–7.7)
Neutrophils Relative %: 52 %
Platelets: 338 10*3/uL (ref 150–400)
RBC: 4.72 MIL/uL (ref 3.87–5.11)
RDW: 13.4 % (ref 11.5–15.5)
WBC: 6.9 10*3/uL (ref 4.0–10.5)
nRBC: 0 % (ref 0.0–0.2)

## 2022-12-17 LAB — BASIC METABOLIC PANEL WITH GFR
Anion gap: 9 (ref 5–15)
BUN: 12 mg/dL (ref 6–20)
CO2: 24 mmol/L (ref 22–32)
Calcium: 9.7 mg/dL (ref 8.9–10.3)
Chloride: 105 mmol/L (ref 98–111)
Creatinine, Ser: 0.69 mg/dL (ref 0.44–1.00)
GFR, Estimated: >60 mL/min
Glucose, Bld: 96 mg/dL (ref 70–99)
Potassium: 3.7 mmol/L (ref 3.5–5.1)
Sodium: 138 mmol/L (ref 135–145)

## 2022-12-17 MED ORDER — ALBUTEROL SULFATE HFA 108 (90 BASE) MCG/ACT IN AERS
2.0000 | INHALATION_SPRAY | RESPIRATORY_TRACT | Status: DC | PRN
  Administered 2022-12-17: 2 via RESPIRATORY_TRACT
  Filled 2022-12-17: qty 6.7

## 2022-12-17 MED ORDER — IPRATROPIUM-ALBUTEROL 0.5-2.5 (3) MG/3ML IN SOLN
3.0000 mL | Freq: Once | RESPIRATORY_TRACT | Status: AC
  Administered 2022-12-17: 3 mL via RESPIRATORY_TRACT
  Filled 2022-12-17: qty 3

## 2022-12-17 MED ORDER — ALBUTEROL SULFATE (2.5 MG/3ML) 0.083% IN NEBU
INHALATION_SOLUTION | RESPIRATORY_TRACT | Status: AC
  Administered 2022-12-17: 5 mg via RESPIRATORY_TRACT
  Filled 2022-12-17: qty 6

## 2022-12-17 MED ORDER — PREDNISONE 20 MG PO TABS: 40.0000 mg | ORAL_TABLET | Freq: Every day | ORAL | 0 refills | Status: DC

## 2022-12-17 MED ORDER — ALBUTEROL SULFATE HFA 108 (90 BASE) MCG/ACT IN AERS: 1.0000 | INHALATION_SPRAY | Freq: Four times a day (QID) | RESPIRATORY_TRACT | 0 refills | Status: AC | PRN

## 2022-12-17 MED ORDER — ALBUTEROL SULFATE (2.5 MG/3ML) 0.083% IN NEBU: 5.0000 mg | INHALATION_SOLUTION | Freq: Once | RESPIRATORY_TRACT | Status: AC

## 2022-12-17 MED ORDER — MAGNESIUM SULFATE 2 GM/50ML IV SOLN: 2.0000 g | Freq: Once | INTRAVENOUS | Status: DC

## 2022-12-17 MED ORDER — METHYLPREDNISOLONE SODIUM SUCC 125 MG IJ SOLR
125.0000 mg | Freq: Once | INTRAMUSCULAR | Status: AC
  Administered 2022-12-17: 125 mg via INTRAVENOUS
  Filled 2022-12-17: qty 2

## 2022-12-17 MED ORDER — PREDNISONE 20 MG PO TABS: 40.0000 mg | ORAL_TABLET | Freq: Every day | ORAL | 0 refills | Status: AC

## 2022-12-17 MED ORDER — ALBUTEROL SULFATE (2.5 MG/3ML) 0.083% IN NEBU
2.5000 mg | INHALATION_SOLUTION | Freq: Once | RESPIRATORY_TRACT | Status: AC
  Administered 2022-12-17: 2.5 mg via RESPIRATORY_TRACT
  Filled 2022-12-17: qty 3

## 2022-12-17 NOTE — ED Provider Notes (Signed)
[place] EMERGENCY DEPARTMENT AT [facility] Provider Note   CSN: [id number] Arrival date & time: 12/17/22  1534     History  Chief Complaint  Patient presents with   Cough   Shortness of Breath    [name] [name] is a 27 y.o. female with PMHx anxiety, asthma, GERD, IBD who presents to ED concerned for cough (x1 week) and SOB (x1 day). Patient also endorses intermittent nausea and diarrhea over the past week.   Denies fever, chest pain, vomiting.    Cough Associated symptoms: shortness of breath   Shortness of Breath Associated symptoms: cough        Home Medications Prior to Admission medications   Medication Sig Start Date End Date Taking? Authorizing Provider  predniSONE (DELTASONE) 20 MG tablet Take 2 tablets (40 mg total) by mouth daily. 12/17/22  Yes Valrie Hart F, PA-C  famotidine (PEPCID) 20 MG tablet Take 20 mg by mouth 2 (two) times daily.    [provider]  ibuprofen (ADVIL) 800 MG tablet Take 1 tablet (800 mg total) by mouth every 8 (eight) hours as needed for moderate pain. 08/04/22   Edwin Dada P, DO  lidocaine (LIDODERM) 5 % Place 1 patch onto the skin daily. Remove & Discard patch within 12 hours or as directed by MD 08/04/22   Edwin Dada P, DO  methocarbamol (ROBAXIN) 500 MG tablet Take 1 tablet (500 mg total) by mouth 2 (two) times daily. 08/04/22   Edwin Dada P, DO  ondansetron (ZOFRAN) 8 MG tablet Take 8 mg by mouth every 8 (eight) hours as needed for nausea or vomiting.    [provider]  Prenatal Vit-Fe Fumarate-FA (MULTIVITAMIN-PRENATAL) 27-0.8 MG TABS tablet Take 1 tablet by mouth daily at 12 noon.    [provider]  promethazine (PHENERGAN) 25 MG tablet TAKE 1 TABLET BY MOUTH EVERY 6 HOURS AS NEEDED FOR NAUSEA OR VOMITING 10/10/21   Levie Heritage, DO      Allergies    Codeine, Doxycycline, Other, and Triamcinolone    Review of Systems   Review of Systems  Respiratory:  Positive for cough  and shortness of breath.     Physical Exam Updated Vital Signs BP (!) 117/105 (BP Location: Right Arm)   Pulse 94   Temp 97.6 F (36.4 C)   Resp (!) 22   LMP 12/14/2022   SpO2 99%  Physical Exam Vitals and nursing note reviewed.  Constitutional:      General: She is not in acute distress.    Appearance: She is not ill-appearing or toxic-appearing.  HENT:     Head: Normocephalic and atraumatic.     Mouth/Throat:     Mouth: Mucous membranes are moist.     Pharynx: No posterior oropharyngeal erythema.  Eyes:     General: No scleral icterus.       Right eye: No discharge.        Left eye: No discharge.     Conjunctiva/sclera: Conjunctivae normal.  Cardiovascular:     Rate and Rhythm: Normal rate and regular rhythm.     Pulses: Normal pulses.     Heart sounds: Normal heart sounds. No murmur heard. Pulmonary:     Effort: Pulmonary effort is normal. No respiratory distress.     Breath sounds: Wheezing present. No rhonchi or rales.     Comments: Lungs initially sound tight with expiratory wheezing that started to resolve with breathing treatment.  Abdominal:     Tenderness: There  is no abdominal tenderness.  Musculoskeletal:     Right lower leg: No edema.     Left lower leg: No edema.  Skin:    General: Skin is warm and dry.     Findings: No rash.  Neurological:     General: No focal deficit present.     Mental Status: She is alert. Mental status is at baseline.  Psychiatric:        Mood and Affect: Mood normal.     ED Results / Procedures / Treatments   Labs (all labs ordered are listed, but only abnormal results are displayed) Labs Reviewed  CBC WITH DIFFERENTIAL/PLATELET  BASIC METABOLIC PANEL  PREGNANCY, URINE    EKG None  Radiology No results found.  Procedures Procedures    Medications Ordered in ED Medications  albuterol (VENTOLIN HFA) 108 (90 Base) MCG/ACT inhaler 2 puff (2 puffs Inhalation Given 12/17/22 1641)  albuterol (PROVENTIL) (2.5  MG/3ML) 0.083% nebulizer solution 5 mg (5 mg Nebulization Given 12/17/22 1641)  albuterol (PROVENTIL) (2.5 MG/3ML) 0.083% nebulizer solution 5 mg (5 mg Nebulization Given 12/17/22 1628)  ipratropium-albuterol (DUONEB) 0.5-2.5 (3) MG/3ML nebulizer solution 3 mL (3 mLs Nebulization Given 12/17/22 1714)  albuterol (PROVENTIL) (2.5 MG/3ML) 0.083% nebulizer solution 2.5 mg (2.5 mg Nebulization Given 12/17/22 1714)  methylPREDNISolone sodium succinate (SOLU-MEDROL) 125 mg/2 mL injection 125 mg (125 mg Intravenous Given 12/17/22 1744)    ED Course/ Medical Decision Making/ A&P Clinical Course as of 12/17/22 1900  Mon Dec 17, 2022  1857 Asthma exacerbation. Pending CXR. Likely discharge home. [OZ]    Clinical Course User Index [OZ] Smitty Knudsen, PA-C                                 Medical Decision Making Amount and/or Complexity of Data Reviewed Labs: ordered. Radiology: ordered.  Risk Prescription drug management.   This patient presents to the ED for concern of shortness of breath, this involves an extensive number of treatment options, and is a complaint that carries with it a high risk of complications and morbidity.  The differential diagnosis includes Anxiety, Anaphylaxis/Angioedema, Aspirated FB, Arrhythmia, CHF, Asthma, COPD, PNA, COVID/Flu/RSV, STEMI, Tamponade, TPNX, Sepsis   Co morbidities that complicate the patient evaluation  Asthma   Additional history obtained:  Dr. Cliffton Asters PCP   Lab Tests:  I Ordered, and personally interpreted labs.  The pertinent results include:   - BMP: no concern for electrolyte abnormality; no concern for kidney damage -CBC: No concern for anemia or leukocytosis   Imaging Studies ordered:  I ordered imaging studies including  -Chest x-ray: Assess for process contributing to patient's symptoms I independently visualized and interpreted imaging    Cardiac Monitoring: / EKG:  The patient was maintained on a cardiac monitor.  I  personally viewed and interpreted the cardiac monitored which showed an underlying rhythm of: Sinus rhythm without acute ST changes or arrhythmias   Problem List / ED Course / Critical interventions / Medication management  Patient presents to ED concern for asthma exacerbation.  Patient stating that she has had shortness of breath x 1 day.  Also endorses cough and nausea/diarrhea over the past week.  Patient afebrile with stable vitals. Oxygen throughout ED stay was always above 96% on RA. Initial physical exam with tight sounding lungs and expiratory wheezing.  This cleared up after breathing treatments and IV steroids. BMP reassuring.  CBC without leukocytosis or anemia.  Patient stating that she feels a lot better and is ready to go home.  Educated patient that I will send her home with prednisone to take over the next couple days.  Recommended following up with primary care provider.  Patient verbalized understanding of plan. I have reviewed the patients home medicines and have made adjustments as needed  DDx: These are considered less likely due to history of present illness and physical exam findings Aspirated FB: no history of choking Arrhythmia/STEMI: EKG reassuring CHF: no physical exam findings TPNX: Lungs clear to auscultation bilaterally Sepsis: afebrile and other vital signs stable   Social Determinants of Health:  None  7:00 PM Care of [name] [name] transferred to PA Zelaya at the end of my shift as the patient will require reassessment once labs/imaging have resulted. Patient presentation, ED course, and plan of care discussed with review of all pertinent labs and imaging. Please see his/her note for further details regarding further ED course and disposition. Plan at time of handoff is reassess patient after xray results. If concerned for PNA - patient will need ABX. I have already sent prednisone to their pharmacy.  This may be altered or completely changed at the  discretion of the oncoming team pending results of further workup.           Final Clinical Impression(s) / ED Diagnoses Final diagnoses:  Mild intermittent asthma with exacerbation    Rx / DC Orders ED Discharge Orders          Ordered    predniSONE (DELTASONE) 20 MG tablet  Daily        12/17/22 1840              Dorthy Cooler, [place] 12/17/22 1900    Royanne Foots, DO 12/25/22 1137

## 2022-12-17 NOTE — ED Triage Notes (Signed)
Cough with SOB x 1 week. States it hurts with inspiration

## 2022-12-17 NOTE — Discharge Instructions (Addendum)
It was a pleasure caring for you today.  Workup was reassuring.  I have sent your prednisone and albuterol to your pharmacy.  Please follow-up with your primary care provider.  Seek emergency care if experiencing any new or worsening symptoms.

## 2023-04-22 ENCOUNTER — Emergency Department (HOSPITAL_BASED_OUTPATIENT_CLINIC_OR_DEPARTMENT_OTHER)
Admission: EM | Admit: 2023-04-22 | Discharge: 2023-04-22 | Disposition: A | Discharge status: Home / Self Care | Payer: Self-pay | Attending: Emergency Medicine | Admitting: Emergency Medicine

## 2023-04-22 ENCOUNTER — Other Ambulatory Visit: Payer: Self-pay

## 2023-04-22 ENCOUNTER — Encounter (HOSPITAL_BASED_OUTPATIENT_CLINIC_OR_DEPARTMENT_OTHER): Payer: Self-pay | Admitting: Emergency Medicine

## 2023-04-22 DIAGNOSIS — K047 Periapical abscess without sinus: Secondary | ICD-10-CM | POA: Insufficient documentation

## 2023-04-22 MED ORDER — CLINDAMYCIN HCL 150 MG PO CAPS: 450.0000 mg | ORAL_CAPSULE | Freq: Three times a day (TID) | ORAL | 0 refills | Status: DC

## 2023-04-22 MED ORDER — OXYCODONE-ACETAMINOPHEN 5-325 MG PO TABS: 1.0000 | ORAL_TABLET | Freq: Three times a day (TID) | ORAL | 0 refills | Status: AC | PRN

## 2023-04-22 MED ORDER — OXYCODONE-ACETAMINOPHEN 5-325 MG PO TABS: 1.0000 | ORAL_TABLET | Freq: Three times a day (TID) | ORAL | 0 refills | Status: DC | PRN

## 2023-04-22 MED ORDER — CLINDAMYCIN HCL 150 MG PO CAPS
150.0000 mg | ORAL_CAPSULE | Freq: Once | ORAL | Status: AC
  Administered 2023-04-22: 150 mg via ORAL
  Filled 2023-04-22: qty 1

## 2023-04-22 MED ORDER — CLINDAMYCIN HCL 150 MG PO CAPS: 450.0000 mg | ORAL_CAPSULE | Freq: Three times a day (TID) | ORAL | 0 refills | Status: AC

## 2023-04-22 NOTE — Discharge Instructions (Addendum)
 Take Clindamycin 450 mg 3 times a day for the next 7 days.  Will take about 2 to 3 days for the antibiotics to kick in.  Please complete full course of antibiotics to ensure resolution of infection.  Alternate between Ibuprofen 600 mg and Tylenol 500 mg every 4 hours for pain.  You can also ice for additional relief of swelling.  Take Percocet every 8 hours as needed for breakthrough pain.  Percocet is a narcotic.  It can cause drowsiness so do not drive or operate heavy machinery or take this medication.  You can also cause constipation so consider taking MiraLAX if you not having regular bowel movements.  Contact one of the dentists on the list provided to schedule an appointment for reevaluation.  Get help right away if: You have a fever or chills. Your symptoms suddenly get worse. You have a very bad headache. You have problems breathing or swallowing. You have trouble opening your mouth. You have swelling in your neck or close to your eye.

## 2023-04-22 NOTE — ED Triage Notes (Signed)
 Patient c/o left lower molar pain, possible abscess x 3 days, worse since last night.  Patient denies fevers at home.  Patient last took tylenol and advil this AM.

## 2023-04-22 NOTE — ED Provider Notes (Signed)
 [place] EMERGENCY DEPARTMENT AT [facility] Provider Note   CSN: [id number] Arrival date & time: 04/22/23  1727     History  Chief Complaint  Patient presents with   Dental Pain    [name] [name] is a 28 y.o. female with no significant past medical history presents the ED today for dental pain.  Patient reports pain at the lower gums for the past 3 days.  This morning she noticed an well.  Has been taking Tylenol and icing her face with some improvement of symptoms.  Denies fevers, inability to open her jaw, or swelling of the mouth.  She has not established with a dentist.  No additional complaints or concerns at this time.    Home Medications Prior to Admission medications   Medication Sig Start Date End Date Taking? Authorizing Provider  albuterol (VENTOLIN HFA) 108 (90 Base) MCG/ACT inhaler Inhale 1-2 puffs into the lungs every 6 (six) hours as needed for wheezing or shortness of breath. 12/17/22   Smitty Knudsen, PA-C  clindamycin (CLEOCIN) 150 MG capsule Take 3 capsules (450 mg total) by mouth 3 (three) times daily for 7 days. 04/22/23 04/29/23  Maxwell Marion, PA-C  famotidine (PEPCID) 20 MG tablet Take 20 mg by mouth 2 (two) times daily.    [provider]  ibuprofen (ADVIL) 800 MG tablet Take 1 tablet (800 mg total) by mouth every 8 (eight) hours as needed for moderate pain. 08/04/22   Edwin Dada P, DO  lidocaine (LIDODERM) 5 % Place 1 patch onto the skin daily. Remove & Discard patch within 12 hours or as directed by MD 08/04/22   Edwin Dada P, DO  methocarbamol (ROBAXIN) 500 MG tablet Take 1 tablet (500 mg total) by mouth 2 (two) times daily. 08/04/22   Edwin Dada P, DO  ondansetron (ZOFRAN) 8 MG tablet Take 8 mg by mouth every 8 (eight) hours as needed for nausea or vomiting.    [provider]  oxyCODONE-acetaminophen (PERCOCET/ROXICET) 5-325 MG tablet Take 1 tablet by mouth every 8 (eight) hours as needed for up to 3 days for severe pain  (pain score 7-10). 04/22/23 04/25/23  Maxwell Marion, PA-C  predniSONE (DELTASONE) 20 MG tablet Take 2 tablets (40 mg total) by mouth daily. 12/17/22   Smitty Knudsen, PA-C  Prenatal Vit-Fe Fumarate-FA (MULTIVITAMIN-PRENATAL) 27-0.8 MG TABS tablet Take 1 tablet by mouth daily at 12 noon.    [provider]  promethazine (PHENERGAN) 25 MG tablet TAKE 1 TABLET BY MOUTH EVERY 6 HOURS AS NEEDED FOR NAUSEA OR VOMITING 10/10/21   Levie Heritage, DO      Allergies    Codeine, Doxycycline, Other, and Triamcinolone    Review of Systems   Review of Systems  HENT:  Positive for dental problem.   All other systems reviewed and are negative.   Physical Exam Updated Vital Signs BP (!) 135/97   Pulse 69   Temp 97.7 F (36.5 C) (Oral)   Resp 18   Wt 86 kg   LMP 04/18/2023 (Approximate)   SpO2 99%   BMI 33.06 kg/m  Physical Exam Vitals and nursing note reviewed.  Constitutional:      Appearance: Normal appearance.  HENT:     Head: Normocephalic and atraumatic.     Mouth/Throat:     Mouth: Mucous membranes are moist.     Comments: Poor dentition. Abscess present at the gum of her bottom left incisor. Eyes:     Conjunctiva/sclera: Conjunctivae normal.  Pupils: Pupils are equal, round, and reactive to light.  Cardiovascular:     Rate and Rhythm: Normal rate and regular rhythm.     Pulses: Normal pulses.     Heart sounds: Normal heart sounds.  Pulmonary:     Effort: Pulmonary effort is normal.     Breath sounds: Normal breath sounds.  Abdominal:     Palpations: Abdomen is soft.     Tenderness: There is no abdominal tenderness.  Skin:    General: Skin is warm and dry.     Findings: No rash.  Neurological:     General: No focal deficit present.     Mental Status: She is alert.  Psychiatric:        Mood and Affect: Mood normal.        Behavior: Behavior normal.     ED Results / Procedures / Treatments   Labs (all labs ordered are listed, but only abnormal results are  displayed) Labs Reviewed - No data to display  EKG None  Radiology No results found.  Procedures Procedures: not indicated.   Medications Ordered in ED Medications  clindamycin (CLEOCIN) capsule 150 mg (150 mg Oral Given 04/22/23 1923)    ED Course/ Medical Decision Making/ A&P                                 Medical Decision Making Risk Prescription drug management.   This patient presents to the ED for concern of dental pain, this involves an extensive number of treatment options, and is a complaint that carries with it a high risk of complications and morbidity.   Differential diagnosis includes: dental fracture, abscess, pulpitis, gingivitis, etc.   Comorbidities  See HPI above   Additional History  Additional history obtained from prior records   Problem List / ED Course / Critical Interventions / Medication Management  Dental pain x3 days with abscess present since this morning.  Has been taking OTC analgesics without much improvement of symptoms.  She is not established with a dentist. I ordered medications including: Clindamycin for dental abscess - given prior to discharge  I have reviewed the patients home medicines and have made adjustments as needed Prescription for Percocet sent to the pharmacy.  Paper prescription for clindamycin given since it would not send electronically. Information for local dentists provided.   Social Determinants of Health  Access to healthcare   Test / Admission - Considered  Discussed findings with patient.  All questions were answered. She is hemodynamically stable and safe for discharge home. Strict return precautions given.       Final Clinical Impression(s) / ED Diagnoses Final diagnoses:  Dental abscess    Rx / DC Orders ED Discharge Orders          Ordered    clindamycin (CLEOCIN) 150 MG capsule  3 times daily,   Status:  Discontinued        04/22/23 1921    oxyCODONE-acetaminophen  (PERCOCET/ROXICET) 5-325 MG tablet  Every 8 hours PRN,   Status:  Discontinued        04/22/23 1921    clindamycin (CLEOCIN) 150 MG capsule  3 times daily,   Status:  Discontinued        04/22/23 1922    oxyCODONE-acetaminophen (PERCOCET/ROXICET) 5-325 MG tablet  Every 8 hours PRN,   Status:  Discontinued        04/22/23 1922    oxyCODONE-acetaminophen (  PERCOCET/ROXICET) 5-325 MG tablet  Every 8 hours PRN        04/22/23 1922    clindamycin (CLEOCIN) 150 MG capsule  3 times daily,   Status:  Discontinued        04/22/23 1922    clindamycin (CLEOCIN) 150 MG capsule  3 times daily,   Status:  Discontinued        04/22/23 1924    clindamycin (CLEOCIN) 150 MG capsule  3 times daily        04/22/23 1926              Maxwell Marion, PA-C 04/22/23 1931    Derwood Kaplan, MD 04/23/23 2330
